# Patient Record
Sex: Female | Born: 1995 | Race: White | Hispanic: No | Marital: Single | Smoking: Former smoker
Health system: Southern US, Community
[De-identification: ages and names within clinical notes are randomized; demographics above are authoritative.]

## PROBLEM LIST (undated history)

## (undated) ENCOUNTER — Inpatient Hospital Stay: Payer: Self-pay

## (undated) DIAGNOSIS — Q51818 Other congenital malformations of uterus: Secondary | ICD-10-CM

## (undated) DIAGNOSIS — O99213 Obesity complicating pregnancy, third trimester: Secondary | ICD-10-CM

## (undated) DIAGNOSIS — A64 Unspecified sexually transmitted disease: Secondary | ICD-10-CM

## (undated) DIAGNOSIS — Z91410 Personal history of adult physical and sexual abuse: Secondary | ICD-10-CM

## (undated) DIAGNOSIS — F329 Major depressive disorder, single episode, unspecified: Secondary | ICD-10-CM

## (undated) DIAGNOSIS — F32A Depression, unspecified: Secondary | ICD-10-CM

## (undated) DIAGNOSIS — F4310 Post-traumatic stress disorder, unspecified: Secondary | ICD-10-CM

## (undated) DIAGNOSIS — IMO0002 Reserved for concepts with insufficient information to code with codable children: Secondary | ICD-10-CM

## (undated) DIAGNOSIS — Z6841 Body Mass Index (BMI) 40.0 and over, adult: Secondary | ICD-10-CM

## (undated) HISTORY — PX: NO PAST SURGERIES: SHX2092

---
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
## 2014-03-13 LAB — OB RESULTS CONSOLE HEPATITIS B SURFACE ANTIGEN: Hepatitis B Surface Ag: NEGATIVE

## 2014-03-13 LAB — OB RESULTS CONSOLE RUBELLA ANTIBODY, IGM: RUBELLA: IMMUNE

## 2014-03-13 LAB — OB RESULTS CONSOLE GBS: STREP GROUP B AG: POSITIVE

## 2014-03-13 LAB — OB RESULTS CONSOLE GC/CHLAMYDIA
Chlamydia: NEGATIVE
GC PROBE AMP, GENITAL: NEGATIVE

## 2014-03-13 LAB — OB RESULTS CONSOLE VARICELLA ZOSTER ANTIBODY, IGG: VARICELLA IGG: IMMUNE

## 2014-03-13 LAB — OB RESULTS CONSOLE HIV ANTIBODY (ROUTINE TESTING): HIV: NONREACTIVE

## 2014-04-24 NOTE — L&D Delivery Note (Addendum)
Delivery Summary for Iona BeardMegan D Gillham  Labor Events:   Preterm labor:   Rupture date:   Rupture time:   Rupture type: Spontaneous  Fluid Color: Particulate Meconium  Induction:   Augmentation:   Complications:   Cervical ripening:          Delivery:   Episiotomy:   Lacerations:   Repair suture:   Repair # of packets:   Blood loss (ml): 600   Information for the patient's newborn:  Verdene LennertJudy, Boy Laparis [[id number]][[id number]]    Delivery 11/05/2014 1:13 AM by  Vaginal, Vacuum (Extractor) Sex:  female Gestational Age: 2552w2d Delivery Clinician:  Hildred LaserAnika Tristine Langi Living?: Yes        APGARS  One minute Five minutes Ten minutes  Skin color: 0   1      Heart rate: 2   2      Grimace: 2   2      Muscle tone: 0   1      Breathing: 2   2      Totals: 6  8      Presentation/position: Vertex   Occiput Anterior Resuscitation: See NICU Consult (infant's chart) Yes = See NRP documentation in Infant Chart  Cord information: 3 vessels   Disposition of cord blood:     Blood gases sent?  Complications:   Placenta: Delivered: 11/05/2014 1:26 AM  Spontaneous  Intact appearance Newborn Measurements: Weight: 8 lb 3.9 oz (3739 g)  Height: 22.05"  Head circumference:    Chest circumference:    Other providers: Delivery Nurse Vennie HomansMichele A Taylor  Additional  information: Forceps:   Vacuum: Low  Breech:   Observed anomalies       See vacuum delivery procedure note for details of delivery (including vacuum assistance and shoulder dystocia).   Hildred LaserAnika Pheng Prokop, MD [facility] 11/05/2014 1:48 AM

## 2014-10-07 ENCOUNTER — Emergency Department
Admission: EM | Admit: 2014-10-07 | Discharge: 2014-10-07 | Disposition: A | Discharge status: Home / Self Care | Payer: Medicaid Other | Attending: Emergency Medicine | Admitting: Emergency Medicine

## 2014-10-07 ENCOUNTER — Encounter: Payer: Self-pay | Admitting: General Practice

## 2014-10-07 DIAGNOSIS — Z87891 Personal history of nicotine dependence: Secondary | ICD-10-CM | POA: Insufficient documentation

## 2014-10-07 DIAGNOSIS — S80811A Abrasion, right lower leg, initial encounter: Secondary | ICD-10-CM | POA: Insufficient documentation

## 2014-10-07 DIAGNOSIS — Z3A36 36 weeks gestation of pregnancy: Secondary | ICD-10-CM | POA: Diagnosis not present

## 2014-10-07 DIAGNOSIS — W231XXA Caught, crushed, jammed, or pinched between stationary objects, initial encounter: Secondary | ICD-10-CM | POA: Insufficient documentation

## 2014-10-07 DIAGNOSIS — Y998 Other external cause status: Secondary | ICD-10-CM | POA: Insufficient documentation

## 2014-10-07 DIAGNOSIS — Y9289 Other specified places as the place of occurrence of the external cause: Secondary | ICD-10-CM | POA: Insufficient documentation

## 2014-10-07 DIAGNOSIS — Y9389 Activity, other specified: Secondary | ICD-10-CM | POA: Diagnosis not present

## 2014-10-07 DIAGNOSIS — O9A213 Injury, poisoning and certain other consequences of external causes complicating pregnancy, third trimester: Secondary | ICD-10-CM | POA: Insufficient documentation

## 2014-10-07 MED ORDER — ACETAMINOPHEN 325 MG PO TABS
650.0000 mg | ORAL_TABLET | Freq: Once | ORAL | Status: AC
  Administered 2014-10-07: 650 mg via ORAL

## 2014-10-07 MED ORDER — ACETAMINOPHEN 325 MG PO TABS
ORAL_TABLET | ORAL | Status: AC
  Administered 2014-10-07: 650 mg via ORAL
  Filled 2014-10-07: qty 2

## 2014-10-07 MED ORDER — BACITRACIN ZINC 500 UNIT/GM EX OINT
TOPICAL_OINTMENT | CUTANEOUS | Status: AC
  Filled 2014-10-07: qty 1.8

## 2014-10-07 MED ORDER — BACITRACIN ZINC 500 UNIT/GM EX OINT: TOPICAL_OINTMENT | Freq: Two times a day (BID) | CUTANEOUS | Status: DC

## 2014-10-07 NOTE — Discharge Instructions (Signed)
Exam is reassuring and your be treated for abrasions. Clean with soap and water which is mild, and place and antibiotic ointment or Vaseline over the abrasions and cover with gauze until healed. Return to the emergency department for any new or worsening calf pain, calf swelling, or any signs of infection including wound drainage redness or pain.   Abrasion An abrasion is a cut or scrape of the skin. Abrasions do not extend through all layers of the skin and most heal within 10 days. It is important to care for your abrasion properly to prevent infection. CAUSES  Most abrasions are caused by falling on, or gliding across, the ground or other surface. When your skin rubs on something, the outer and inner layer of skin rubs off, causing an abrasion. DIAGNOSIS  Your caregiver will be able to diagnose an abrasion during a physical exam.  TREATMENT  Your treatment depends on how large and deep the abrasion is. Generally, your abrasion will be cleaned with water and a mild soap to remove any dirt or debris. An antibiotic ointment may be put over the abrasion to prevent an infection. A bandage (dressing) may be wrapped around the abrasion to keep it from getting dirty.  You may need a tetanus shot if:  You cannot remember when you had your last tetanus shot.  You have never had a tetanus shot.  The injury broke your skin. If you get a tetanus shot, your arm may swell, get red, and feel warm to the touch. This is common and not a problem. If you need a tetanus shot and you choose not to have one, there is a rare chance of getting tetanus. Sickness from tetanus can be serious.  HOME CARE INSTRUCTIONS   If a dressing was applied, change it at least once a day or as directed by your caregiver. If the bandage sticks, soak it off with warm water.   Wash the area with water and a mild soap to remove all the ointment 2 times a day. Rinse off the soap and pat the area dry with a clean towel.   Reapply any  ointment as directed by your caregiver. This will help prevent infection and keep the bandage from sticking. Use gauze over the wound and under the dressing to help keep the bandage from sticking.   Change your dressing right away if it becomes wet or dirty.   Only take over-the-counter or prescription medicines for pain, discomfort, or fever as directed by your caregiver.   Follow up with your caregiver within 24-48 hours for a wound check, or as directed. If you were not given a wound-check appointment, look closely at your abrasion for redness, swelling, or pus. These are signs of infection. SEEK IMMEDIATE MEDICAL CARE IF:   You have increasing pain in the wound.   You have redness, swelling, or tenderness around the wound.   You have pus coming from the wound.   You have a fever or persistent symptoms for more than 2-3 days.  You have a fever and your symptoms suddenly get worse.  You have a bad smell coming from the wound or dressing.  MAKE SURE YOU:   Understand these instructions.  Will watch your condition.  Will get help right away if you are not doing well or get worse. Document Released: 01/18/2005 Document Revised: 03/27/2012 Document Reviewed: 03/14/2011 [facility] Patient Information 2015 [place], [place]. This information is not intended to replace advice given to you by your health care  provider. Make sure you discuss any questions you have with your health care provider. ° °
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
## 2014-10-07 NOTE — ED Notes (Signed)
Pt discharged home after verbalizing understanding of discharge instructions; nad noted. 

## 2014-10-07 NOTE — ED Notes (Signed)
Pt barcode not scanned because she doesn't have a bracelet. Pt access notified and they will get her a bracelet.

## 2014-10-08 ENCOUNTER — Observation Stay
Admission: EM | Admit: 2014-10-08 | Discharge: 2014-10-08 | Disposition: A | Discharge status: Home / Self Care | Payer: Medicaid Other | Attending: Obstetrics and Gynecology | Admitting: Obstetrics and Gynecology

## 2014-10-08 DIAGNOSIS — O26899 Other specified pregnancy related conditions, unspecified trimester: Principal | ICD-10-CM | POA: Insufficient documentation

## 2014-10-08 DIAGNOSIS — M549 Dorsalgia, unspecified: Secondary | ICD-10-CM | POA: Diagnosis present

## 2014-10-08 HISTORY — DX: Depression, unspecified: F32.A

## 2014-10-08 HISTORY — DX: Major depressive disorder, single episode, unspecified: F32.9

## 2014-10-08 LAB — URINE DRUG SCREEN, QUALITATIVE (ARMC ONLY)
AMPHETAMINES, UR SCREEN: NOT DETECTED
BARBITURATES, UR SCREEN: NOT DETECTED
Benzodiazepine, Ur Scrn: NOT DETECTED
CANNABINOID 50 NG, UR ~~LOC~~: NOT DETECTED
Cocaine Metabolite,Ur ~~LOC~~: NOT DETECTED
MDMA (ECSTASY) UR SCREEN: NOT DETECTED
Methadone Scn, Ur: NOT DETECTED
OPIATE, UR SCREEN: NOT DETECTED
PHENCYCLIDINE (PCP) UR S: NOT DETECTED
Tricyclic, Ur Screen: NOT DETECTED

## 2014-10-08 LAB — PROTEIN / CREATININE RATIO, URINE
Creatinine, Urine: 116 mg/dL
PROTEIN CREATININE RATIO: 1.12 mg/mg{creat} — AB (ref 0.00–0.15)
Total Protein, Urine: 130 mg/dL

## 2014-10-08 LAB — URINALYSIS COMPLETE WITH MICROSCOPIC (ARMC ONLY)
BACTERIA UA: NONE SEEN
BILIRUBIN URINE: NEGATIVE
GLUCOSE, UA: NEGATIVE mg/dL
Hgb urine dipstick: NEGATIVE
Ketones, ur: NEGATIVE mg/dL
Leukocytes, UA: NEGATIVE
Nitrite: NEGATIVE
Protein, ur: 100 mg/dL — AB
SPECIFIC GRAVITY, URINE: 1.012 (ref 1.005–1.030)
pH: 6 (ref 5.0–8.0)

## 2014-10-08 LAB — COMPREHENSIVE METABOLIC PANEL
ALT: 20 U/L (ref 14–54)
AST: 24 U/L (ref 15–41)
Albumin: 2.8 g/dL — ABNORMAL LOW (ref 3.5–5.0)
Alkaline Phosphatase: 159 U/L — ABNORMAL HIGH (ref 38–126)
Anion gap: 10 (ref 5–15)
BUN: 5 mg/dL — ABNORMAL LOW (ref 6–20)
CALCIUM: 8.6 mg/dL — AB (ref 8.9–10.3)
CO2: 20 mmol/L — AB (ref 22–32)
CREATININE: 0.57 mg/dL (ref 0.44–1.00)
Chloride: 107 mmol/L (ref 101–111)
GFR calc non Af Amer: >60 mL/min (ref >60)
Glucose, Bld: 89 mg/dL (ref 65–99)
Potassium: 3.5 mmol/L (ref 3.5–5.1)
Sodium: 137 mmol/L (ref 135–145)
TOTAL PROTEIN: 6.7 g/dL (ref 6.5–8.1)
Total Bilirubin: 0.3 mg/dL (ref 0.3–1.2)

## 2014-10-08 LAB — CBC
HCT: 36.8 % (ref 35.0–47.0)
HEMOGLOBIN: 12.8 g/dL (ref 12.0–16.0)
MCH: 30.4 pg (ref 26.0–34.0)
MCHC: 34.7 g/dL (ref 32.0–36.0)
MCV: 87.8 fL (ref 80.0–100.0)
Platelets: 273 10*3/uL (ref 150–440)
RBC: 4.2 MIL/uL (ref 3.80–5.20)
RDW: 13.9 % (ref 11.5–14.5)
WBC: 12 10*3/uL — AB (ref 3.6–11.0)

## 2014-10-08 MED ORDER — HYDRALAZINE HCL 20 MG/ML IJ SOLN: 10.0000 mg | Freq: Once | INTRAMUSCULAR | Status: DC | PRN

## 2014-10-08 MED ORDER — LABETALOL HCL 5 MG/ML IV SOLN
20.0000 mg | INTRAVENOUS | Status: DC | PRN
Start: 2014-10-08 — End: 2014-10-09

## 2014-10-08 MED ORDER — ACETAMINOPHEN 325 MG PO TABS
ORAL_TABLET | ORAL | Status: AC
  Administered 2014-10-08: 650 mg via ORAL
  Filled 2014-10-08: qty 2

## 2014-10-08 MED ORDER — ACETAMINOPHEN 325 MG PO TABS
650.0000 mg | ORAL_TABLET | Freq: Four times a day (QID) | ORAL | Status: DC | PRN
  Administered 2014-10-08: 650 mg via ORAL

## 2014-10-08 NOTE — OB Triage Note (Addendum)
Call provider or return to birthplace with: ° °1. Regular contractions °2. Leaking of fluid from your vaginal °3. Vaginal bleeding: Bright red or heavy like a period °4. Decreased Fetal movement ° °

## 2014-10-08 NOTE — OB Triage Note (Signed)
Preterm d/c instructions reviewed. AVS given and discussed. Pt d/c home.  
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
## 2014-10-09 LAB — CHLAMYDIA/NGC RT PCR (ARMC ONLY)
Chlamydia Tr: NOT DETECTED
N gonorrhoeae: NOT DETECTED

## 2014-11-02 ENCOUNTER — Observation Stay
Admission: EM | Admit: 2014-11-02 | Discharge: 2014-11-02 | Disposition: A | Discharge status: Home / Self Care | Payer: Medicaid Other | Attending: Obstetrics and Gynecology | Admitting: Obstetrics and Gynecology

## 2014-11-02 DIAGNOSIS — O479 False labor, unspecified: Secondary | ICD-10-CM | POA: Diagnosis present

## 2014-11-02 LAB — PROTEIN / CREATININE RATIO, URINE
CREATININE, URINE: 159 mg/dL
Protein Creatinine Ratio: 0.36 mg/mg{Cre} — ABNORMAL HIGH (ref 0.00–0.15)
TOTAL PROTEIN, URINE: 58 mg/dL

## 2014-11-02 LAB — COMPREHENSIVE METABOLIC PANEL
ALBUMIN: 2.8 g/dL — AB (ref 3.5–5.0)
ALT: 10 U/L — AB (ref 14–54)
AST: 18 U/L (ref 15–41)
Alkaline Phosphatase: 171 U/L — ABNORMAL HIGH (ref 38–126)
Anion gap: 9 (ref 5–15)
BILIRUBIN TOTAL: 0.5 mg/dL (ref 0.3–1.2)
BUN: 7 mg/dL (ref 6–20)
CO2: 22 mmol/L (ref 22–32)
CREATININE: 0.53 mg/dL (ref 0.44–1.00)
Calcium: 8.7 mg/dL — ABNORMAL LOW (ref 8.9–10.3)
Chloride: 105 mmol/L (ref 101–111)
GFR calc Af Amer: >60 mL/min (ref >60)
GFR calc non Af Amer: >60 mL/min (ref >60)
Glucose, Bld: 80 mg/dL (ref 65–99)
Potassium: 4 mmol/L (ref 3.5–5.1)
Sodium: 136 mmol/L (ref 135–145)
Total Protein: 6.5 g/dL (ref 6.5–8.1)

## 2014-11-02 LAB — CBC
HCT: 35.8 % (ref 35.0–47.0)
HEMOGLOBIN: 12.1 g/dL (ref 12.0–16.0)
MCH: 29.6 pg (ref 26.0–34.0)
MCHC: 34 g/dL (ref 32.0–36.0)
MCV: 87.2 fL (ref 80.0–100.0)
Platelets: 275 10*3/uL (ref 150–440)
RBC: 4.1 MIL/uL (ref 3.80–5.20)
RDW: 14.1 % (ref 11.5–14.5)
WBC: 14.4 10*3/uL — AB (ref 3.6–11.0)

## 2014-11-02 LAB — URIC ACID: Uric Acid, Serum: 4.9 mg/dL (ref 2.3–6.6)

## 2014-11-02 NOTE — OB Triage Note (Signed)
Dr Valentino Saxonherry called and notified of pts arrival, pt is G1 at 39.6 weeks with c/o ctxs, [facility]. Notified of pts SVE, ctx pattern, FHR reactive with intermittant vbl decels, elevated BP's, pt missed her last appt the end of June so no recent SVE has been done. 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 Orders received to draw PIH labs and recheck cervix x 1.5 hours.

## 2014-11-02 NOTE — OB Triage Note (Signed)
Received pt to LDR 3 from home with c/o ctxs since 0300 this AM. Pt states good FM, denies any vag bleeding or SROM but c/o increase in vag discharge. Urine specimen obtained and EFM applied.

## 2014-11-04 ENCOUNTER — Encounter: Payer: Self-pay | Admitting: Obstetrics and Gynecology

## 2014-11-04 ENCOUNTER — Inpatient Hospital Stay: Payer: Medicaid Other | Admitting: Anesthesiology

## 2014-11-04 ENCOUNTER — Inpatient Hospital Stay
Admission: EM | Admit: 2014-11-04 | Discharge: 2014-11-07 | DRG: 774 | Disposition: A | Discharge status: Home / Self Care | Payer: Medicaid Other | Attending: Obstetrics and Gynecology | Admitting: Obstetrics and Gynecology

## 2014-11-04 DIAGNOSIS — F4310 Post-traumatic stress disorder, unspecified: Secondary | ICD-10-CM | POA: Diagnosis present

## 2014-11-04 DIAGNOSIS — O411030 Infection of amniotic sac and membranes, unspecified, third trimester, not applicable or unspecified: Secondary | ICD-10-CM | POA: Diagnosis present

## 2014-11-04 DIAGNOSIS — Z3A40 40 weeks gestation of pregnancy: Secondary | ICD-10-CM | POA: Diagnosis present

## 2014-11-04 DIAGNOSIS — O9081 Anemia of the puerperium: Secondary | ICD-10-CM | POA: Diagnosis not present

## 2014-11-04 DIAGNOSIS — Z6841 Body Mass Index (BMI) 40.0 and over, adult: Secondary | ICD-10-CM

## 2014-11-04 DIAGNOSIS — R000 Tachycardia, unspecified: Secondary | ICD-10-CM | POA: Diagnosis not present

## 2014-11-04 DIAGNOSIS — Z87891 Personal history of nicotine dependence: Secondary | ICD-10-CM | POA: Diagnosis not present

## 2014-11-04 DIAGNOSIS — Q513 Bicornate uterus: Secondary | ICD-10-CM

## 2014-11-04 DIAGNOSIS — Z91410 Personal history of adult physical and sexual abuse: Secondary | ICD-10-CM | POA: Diagnosis not present

## 2014-11-04 DIAGNOSIS — O133 Gestational [pregnancy-induced] hypertension without significant proteinuria, third trimester: Secondary | ICD-10-CM | POA: Diagnosis present

## 2014-11-04 DIAGNOSIS — O99324 Drug use complicating childbirth: Secondary | ICD-10-CM | POA: Diagnosis present

## 2014-11-04 DIAGNOSIS — O99214 Obesity complicating childbirth: Secondary | ICD-10-CM | POA: Diagnosis present

## 2014-11-04 DIAGNOSIS — O480 Post-term pregnancy: Secondary | ICD-10-CM | POA: Diagnosis present

## 2014-11-04 DIAGNOSIS — Z862 Personal history of diseases of the blood and blood-forming organs and certain disorders involving the immune mechanism: Secondary | ICD-10-CM

## 2014-11-04 DIAGNOSIS — F329 Major depressive disorder, single episode, unspecified: Secondary | ICD-10-CM | POA: Diagnosis present

## 2014-11-04 DIAGNOSIS — O99824 Streptococcus B carrier state complicating childbirth: Secondary | ICD-10-CM | POA: Diagnosis present

## 2014-11-04 DIAGNOSIS — F1210 Cannabis abuse, uncomplicated: Secondary | ICD-10-CM | POA: Diagnosis present

## 2014-11-04 DIAGNOSIS — R509 Fever, unspecified: Secondary | ICD-10-CM | POA: Diagnosis not present

## 2014-11-04 DIAGNOSIS — O411230 Chorioamnionitis, third trimester, not applicable or unspecified: Secondary | ICD-10-CM | POA: Diagnosis present

## 2014-11-04 DIAGNOSIS — Z8759 Personal history of other complications of pregnancy, childbirth and the puerperium: Secondary | ICD-10-CM

## 2014-11-04 HISTORY — DX: Unspecified sexually transmitted disease: A64

## 2014-11-04 HISTORY — DX: Post-traumatic stress disorder, unspecified: F43.10

## 2014-11-04 HISTORY — DX: Reserved for concepts with insufficient information to code with codable children: IMO0002

## 2014-11-04 HISTORY — DX: Body Mass Index (BMI) 40.0 and over, adult: Z684

## 2014-11-04 HISTORY — DX: Personal history of adult physical and sexual abuse: Z91.410

## 2014-11-04 HISTORY — DX: Other congenital malformations of uterus: Q51.818

## 2014-11-04 HISTORY — DX: Obesity complicating pregnancy, third trimester: O99.213

## 2014-11-04 LAB — COMPREHENSIVE METABOLIC PANEL
ALK PHOS: 170 U/L — AB (ref 38–126)
ALT: 15 U/L (ref 14–54)
AST: 29 U/L (ref 15–41)
Albumin: 2.8 g/dL — ABNORMAL LOW (ref 3.5–5.0)
Anion gap: 14 (ref 5–15)
BUN: 7 mg/dL (ref 6–20)
CALCIUM: 8.8 mg/dL — AB (ref 8.9–10.3)
CO2: 18 mmol/L — AB (ref 22–32)
CREATININE: 0.51 mg/dL (ref 0.44–1.00)
Chloride: 102 mmol/L (ref 101–111)
Glucose, Bld: 124 mg/dL — ABNORMAL HIGH (ref 65–99)
POTASSIUM: 3.5 mmol/L (ref 3.5–5.1)
SODIUM: 134 mmol/L — AB (ref 135–145)
Total Bilirubin: 0.5 mg/dL (ref 0.3–1.2)
Total Protein: 7 g/dL (ref 6.5–8.1)

## 2014-11-04 LAB — URINE DRUG SCREEN, QUALITATIVE (ARMC ONLY)
Amphetamines, Ur Screen: NOT DETECTED
BARBITURATES, UR SCREEN: NOT DETECTED
BENZODIAZEPINE, UR SCRN: NOT DETECTED
COCAINE METABOLITE, UR ~~LOC~~: NOT DETECTED
Cannabinoid 50 Ng, Ur ~~LOC~~: NOT DETECTED
MDMA (Ecstasy)Ur Screen: NOT DETECTED
METHADONE SCREEN, URINE: NOT DETECTED
Opiate, Ur Screen: POSITIVE — AB
PHENCYCLIDINE (PCP) UR S: NOT DETECTED
TRICYCLIC, UR SCREEN: NOT DETECTED

## 2014-11-04 LAB — CBC
HCT: 36.2 % (ref 35.0–47.0)
Hemoglobin: 12.2 g/dL (ref 12.0–16.0)
MCH: 29.2 pg (ref 26.0–34.0)
MCHC: 33.6 g/dL (ref 32.0–36.0)
MCV: 87 fL (ref 80.0–100.0)
PLATELETS: 266 10*3/uL (ref 150–440)
RBC: 4.16 MIL/uL (ref 3.80–5.20)
RDW: 14 % (ref 11.5–14.5)
WBC: 21 10*3/uL — AB (ref 3.6–11.0)

## 2014-11-04 LAB — TYPE AND SCREEN
ABO/RH(D): B POS
Antibody Screen: NEGATIVE

## 2014-11-04 LAB — PROTEIN / CREATININE RATIO, URINE
Creatinine, Urine: 132 mg/dL
PROTEIN CREATININE RATIO: 1.2 mg/mg{creat} — AB (ref 0.00–0.15)
Total Protein, Urine: 158 mg/dL

## 2014-11-04 LAB — ABO/RH: ABO/RH(D): B POS

## 2014-11-04 LAB — URIC ACID: Uric Acid, Serum: 5.7 mg/dL (ref 2.3–6.6)

## 2014-11-04 MED ORDER — PROMETHAZINE HCL 25 MG/ML IJ SOLN
INTRAMUSCULAR | Status: AC
  Administered 2014-11-04: 13:00:00 via INTRAMUSCULAR
  Filled 2014-11-04: qty 1

## 2014-11-04 MED ORDER — PHENYLEPHRINE 40 MCG/ML (10ML) SYRINGE FOR IV PUSH (FOR BLOOD PRESSURE SUPPORT): 80.0000 ug | PREFILLED_SYRINGE | INTRAVENOUS | Status: DC | PRN

## 2014-11-04 MED ORDER — LIDOCAINE HCL (PF) 1 % IJ SOLN
INTRAMUSCULAR | Status: AC
  Filled 2014-11-04: qty 30

## 2014-11-04 MED ORDER — AMMONIA AROMATIC IN INHA
RESPIRATORY_TRACT | Status: AC
  Filled 2014-11-04: qty 10

## 2014-11-04 MED ORDER — LACTATED RINGERS IV SOLN
INTRAVENOUS | Status: DC
  Administered 2014-11-04 (×3): via INTRAVENOUS

## 2014-11-04 MED ORDER — LACTATED RINGERS IV SOLN
500.0000 mL | INTRAVENOUS | Status: DC | PRN
  Administered 2014-11-04: 500 mL via INTRAVENOUS

## 2014-11-04 MED ORDER — FENTANYL 2.5 MCG/ML W/ROPIVACAINE 0.2% IN NS 100 ML EPIDURAL INFUSION (ARMC-ANES)
10.0000 mL/h | EPIDURAL | Status: DC
  Administered 2014-11-04 (×3): 10 mL/h via EPIDURAL
  Filled 2014-11-04 (×2): qty 100

## 2014-11-04 MED ORDER — SODIUM CHLORIDE 0.9 % IV SOLN
2.0000 g | Freq: Once | INTRAVENOUS | Status: AC
  Administered 2014-11-04: 2 g via INTRAVENOUS
  Filled 2014-11-04: qty 2000

## 2014-11-04 MED ORDER — MISOPROSTOL 200 MCG PO TABS
ORAL_TABLET | ORAL | Status: AC
  Administered 2014-11-05: 800 ug via RECTAL
  Filled 2014-11-04: qty 4

## 2014-11-04 MED ORDER — LIDOCAINE HCL (PF) 1 % IJ SOLN
INTRAMUSCULAR | Status: AC | PRN
  Administered 2014-11-04: 3 mL

## 2014-11-04 MED ORDER — LIDOCAINE-EPINEPHRINE (PF) 1.5 %-1:200000 IJ SOLN
INTRAMUSCULAR | Status: AC | PRN
  Administered 2014-11-04: 3 mL via EPIDURAL

## 2014-11-04 MED ORDER — OXYTOCIN 10 UNIT/ML IJ SOLN
INTRAMUSCULAR | Status: AC
  Filled 2014-11-04: qty 2

## 2014-11-04 MED ORDER — LABETALOL HCL 100 MG PO TABS
100.0000 mg | ORAL_TABLET | Freq: Once | ORAL | Status: AC
  Administered 2014-11-04: 100 mg via ORAL
  Filled 2014-11-04: qty 1

## 2014-11-04 MED ORDER — BUPIVACAINE HCL (PF) 0.25 % IJ SOLN
INTRAMUSCULAR | Status: AC | PRN
  Administered 2014-11-04: 5 mL
  Administered 2014-11-04 (×2): 4 mL
  Administered 2014-11-04: 5 mL

## 2014-11-04 MED ORDER — DIPHENHYDRAMINE HCL 50 MG/ML IJ SOLN: 12.5000 mg | INTRAMUSCULAR | Status: DC | PRN

## 2014-11-04 MED ORDER — EPHEDRINE 5 MG/ML INJ: 10.0000 mg | INTRAVENOUS | Status: DC | PRN

## 2014-11-04 MED ORDER — SODIUM CHLORIDE 0.9 % IV SOLN
1.0000 g | INTRAVENOUS | Status: DC
  Administered 2014-11-04 – 2014-11-05 (×4): 1 g via INTRAVENOUS
  Filled 2014-11-04 (×4): qty 1000

## 2014-11-04 MED ORDER — BUTORPHANOL TARTRATE 1 MG/ML IJ SOLN
INTRAMUSCULAR | Status: AC
  Administered 2014-11-04: 2 mg via INTRAVENOUS
  Filled 2014-11-04: qty 2

## 2014-11-04 MED ORDER — OXYTOCIN 40 UNITS IN LACTATED RINGERS INFUSION - SIMPLE MED
1.0000 m[IU]/min | INTRAVENOUS | Status: DC
  Administered 2014-11-04: 1 m[IU]/min via INTRAVENOUS
  Filled 2014-11-04: qty 1000

## 2014-11-04 MED ORDER — TERBUTALINE SULFATE 1 MG/ML IJ SOLN: 0.2500 mg | Freq: Once | INTRAMUSCULAR | Status: AC | PRN

## 2014-11-04 MED ORDER — BUTORPHANOL TARTRATE 1 MG/ML IJ SOLN
2.0000 mg | Freq: Once | INTRAMUSCULAR | Status: AC
  Administered 2014-11-04: 2 mg via INTRAVENOUS

## 2014-11-04 MED ORDER — DIPHENHYDRAMINE HCL 50 MG/ML IJ SOLN: 25.0000 mg | Freq: Four times a day (QID) | INTRAMUSCULAR | Status: DC | PRN

## 2014-11-04 MED ORDER — PROMETHAZINE HCL 25 MG/ML IJ SOLN: 25.0000 mg | INTRAMUSCULAR | Status: DC | PRN

## 2014-11-04 NOTE — Progress Notes (Signed)
Intrapartum Progress Note  S: Patient more comfortable.  Has had epidural rebolused with minimal relief.  Given dose of Stadol with marked relief in pain.    O: Blood pressure 133/72, pulse 135, temperature 98.3 F (36.8 C), temperature source Oral, resp. rate 24, height 5\' 1"  (1.549 m), weight 248 lb (112.492 kg), last menstrual period 01/27/2014. Gen App: NAD, comfortable Abdomen: soft, gravid FHT: 140 bpm.  Accels present, occasional shallow variable decels. Moderate variability.   Tocometer:  Contractions q 3-6 min Cervix: 9/90/0/c.  IUPC in place. Extremities: Nontender, no edema.   Labs:  Lab Results  Component Value Date   WBC 21.0* 11/04/2014   HGB 12.2 11/04/2014   HCT 36.2 11/04/2014   MCV 87.0 11/04/2014   PLT 266 11/04/2014     Assessment:  1: SIUP at 6046w1d 2. Gestational HTN 3. SROM 4. GBS+ 5. Latent labor 6. Tachycardia (persistent)  Plan:  1. PIH labs neg.  Continue to monitor BPs, Labetalol IV for BPs > 160/110 2. Ampicillin for GBS 3. IUPC in place 4. Pitocin for augmentation per protocol as needed.  5. Patient still with sinus tachycardia.  Remains asymptomatic. If still present postpartum, will likely get cardiac consult.  6.  Expectant management, anticipate vaginal delivery.    Hildred LaserAnika Rukaya Kleinschmidt, MD 11/04/2014 5:12 PM

## 2014-11-04 NOTE — Progress Notes (Signed)
Intrapartum Progress Note  S: Patient c/o pain on left side near hip with contractions.    O: Blood pressure 132/75, pulse 112, temperature 98.3 F (36.8 C), temperature source Oral, resp. rate 24, height 5\' 1"  (1.549 m), weight 248 lb (112.492 kg), last menstrual period 01/27/2014. Gen App: NAD, comfortable Abdomen: soft, gravid FHT: 138 bpm.  Accels present, no decels. Moderate variability.   Tocometer:  Irregular contractions Cervix: 5/75/-2/c/intact.  Patient had SROM ~ 2 min after cervical check, bloody fluid.  Extremities: Nontender, no edema.   Labs:  Lab Results  Component Value Date   WBC 21.0* 11/04/2014   HGB 12.2 11/04/2014   HCT 36.2 11/04/2014   MCV 87.0 11/04/2014   PLT 266 11/04/2014     Assessment:  1: SIUP at 5976w1d 2. Gestational HTN 3. SROM 4. GBS+ 5. Latent labor 6. Tachycardia (persistent)  Plan:  1. PIH labs neg.  Continue to monitor BPs, Labetalol for BPs > 160/110 2. Ampicillin for GBS 3. IUPC placed to better monitor contractions 4. Pitocin for augmentation per protocol as needed.  5. EKG performed for persistent tachycardia > 130s.  EKG negative except sinus tachycardia.  Dose of Labetalol PO to be given. 6.  Expectant management, anticipate vaginal delivery.    Hildred LaserAnika Ruthmary Occhipinti, MD 11/04/2014 12:49 PM

## 2014-11-04 NOTE — Progress Notes (Signed)
Transition of care note:   Notified by nurse that patient had been admitted under different provider as she was previously thought to be an unassigned patient, however has been seen several times in L&D triage by [facility] providers.  [facility]  [facility]   H&P reviewed, performed by Dr. Thomasene MohairStephen Jackson, agree with current plan of care. I will continue current care and  manage from this point forward.    Renee LaserAnika Kien Mirsky, MD [facility]

## 2014-11-04 NOTE — Anesthesia Procedure Notes (Addendum)
Epidural Patient location during procedure: OB Start time: 11/04/2014 7:45 AM End time: 11/04/2014 8:19 AM  Staffing Anesthesiologist: Forestine ChutePOLIN, CARRIE Resident/CRNA: Junious SilkNOLES, Jep Dyas Performed by: anesthesiologist and resident/CRNA   Preanesthetic Checklist Completed: patient identified, site marked, surgical consent, pre-op evaluation, timeout performed, IV checked, risks and benefits discussed and monitors and equipment checked  Epidural Patient position: sitting Prep: Betadine Patient monitoring: heart rate, continuous pulse ox and blood pressure Approach: midline Location: L4-L5 Injection technique: LOR saline  Needle:  Needle type: Tuohy  Needle gauge: 18 G Needle length: 9 cm and 9 Needle insertion depth: 7 cm Catheter type: closed end flexible Catheter size: 20 Guage Catheter at skin depth: 12 cm Test dose: negative and 1.5% lidocaine with Epi 1:200 K  Assessment Sensory level: T10 Events: blood not aspirated, injection not painful, no injection resistance, negative IV test and no paresthesia  Additional Notes   Patient tolerated the insertion well without complications.  Harrel LemonM. Herald Vallin CRNA attempted 3 times.  [facility] for attempt which was successful.  No complications and pt tolerated well.Reason for block:procedure for pain

## 2014-11-04 NOTE — Progress Notes (Signed)
Cardiopulmonary at bedside to perform EKG

## 2014-11-04 NOTE — Progress Notes (Signed)
Patient was seen by Dr. Valentino Saxonherry at previous visit. Dr. Jean RosenthalJackson made aware and transferred care to Dr. Valentino Saxonherry. Dr. Valentino Saxonherry notified that patient was in unit.
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
## 2014-11-04 NOTE — Progress Notes (Signed)
Catheter not tolerated by patient. Pt.  Wants it removed. Educated patient about the need for in and out cath. during labor with an epidural and associated risk for infection with procedure. Catheter removed per patient request.

## 2014-11-04 NOTE — Progress Notes (Signed)
Dr Valentino Saxonherry p[assing in/out catheter for approx 50  Cc urine- pt tolerated procedure well

## 2014-11-04 NOTE — Progress Notes (Signed)
In/out catheter passed per Dr Valentino Saxonherry fdor approx 100 cc urine- pt tolerated procedure well.

## 2014-11-04 NOTE — Anesthesia Preprocedure Evaluation (Addendum)
Anesthesia Evaluation  Patient identified by MRN, date of birth, ID band Patient awake    Reviewed: Allergy & Precautions, NPO status , Patient's Chart, lab work & pertinent test results  History of Anesthesia Complications Negative for: history of anesthetic complications  Airway Mallampati: III  TM Distance: >3 FB Neck ROM: Full    Dental no notable dental hx.    Pulmonary neg pulmonary ROS, former smoker,  breath sounds clear to auscultation  Pulmonary exam normal       Cardiovascular negative cardio ROS Normal cardiovascular examRhythm:Regular Rate:Normal     Neuro/Psych Depression negative neurological ROS     GI/Hepatic negative GI ROS, Neg liver ROS,   Endo/Other  Morbid obesity  Renal/GU negative Renal ROS  negative genitourinary   Musculoskeletal negative musculoskeletal ROS (+)   Abdominal   Peds negative pediatric ROS (+)  Hematology negative hematology ROS (+)   Anesthesia Other Findings   Reproductive/Obstetrics (+) Pregnancy                            Anesthesia Physical Anesthesia Plan  ASA: III  Anesthesia Plan: Epidural   Post-op Pain Management:    Induction:   Airway Management Planned:   Additional Equipment:   Intra-op Plan:   Post-operative Plan:   Informed Consent: I have reviewed the patients History and Physical, chart, labs and discussed the procedure including the risks, benefits and alternatives for the proposed anesthesia with the patient or authorized representative who has indicated his/her understanding and acceptance.     Plan Discussed with:   Anesthesia Plan Comments:         Anesthesia Quick Evaluation

## 2014-11-05 ENCOUNTER — Encounter
Admission: EM | Disposition: A | Discharge status: Home / Self Care | Payer: Self-pay | Source: Home / Self Care | Attending: Obstetrics and Gynecology

## 2014-11-05 LAB — RPR: RPR: NONREACTIVE

## 2014-11-05 LAB — CBC
HCT: 28.2 % — ABNORMAL LOW (ref 35.0–47.0)
Hemoglobin: 9.3 g/dL — ABNORMAL LOW (ref 12.0–16.0)
MCH: 29 pg (ref 26.0–34.0)
MCHC: 33.1 g/dL (ref 32.0–36.0)
MCV: 87.4 fL (ref 80.0–100.0)
PLATELETS: 282 10*3/uL (ref 150–440)
RBC: 3.23 MIL/uL — ABNORMAL LOW (ref 3.80–5.20)
RDW: 14.3 % (ref 11.5–14.5)
WBC: 27.6 10*3/uL — ABNORMAL HIGH (ref 3.6–11.0)

## 2014-11-05 SURGERY — Surgical Case
Anesthesia: Spinal

## 2014-11-05 MED ORDER — OXYCODONE-ACETAMINOPHEN 5-325 MG PO TABS
2.0000 | ORAL_TABLET | ORAL | Status: DC | PRN
  Administered 2014-11-06: 2 via ORAL
  Filled 2014-11-05: qty 2

## 2014-11-05 MED ORDER — CITRIC ACID-SODIUM CITRATE 334-500 MG/5ML PO SOLN
ORAL | Status: AC
  Filled 2014-11-05: qty 15

## 2014-11-05 MED ORDER — SENNOSIDES-DOCUSATE SODIUM 8.6-50 MG PO TABS
2.0000 | ORAL_TABLET | ORAL | Status: DC
  Filled 2014-11-05: qty 2

## 2014-11-05 MED ORDER — ZOLPIDEM TARTRATE 5 MG PO TABS: 5.0000 mg | ORAL_TABLET | Freq: Every evening | ORAL | Status: DC | PRN

## 2014-11-05 MED ORDER — CEFAZOLIN SODIUM-DEXTROSE 2-3 GM-% IV SOLR
INTRAVENOUS | Status: AC
  Filled 2014-11-05: qty 50

## 2014-11-05 MED ORDER — SIMETHICONE 80 MG PO CHEW
80.0000 mg | CHEWABLE_TABLET | ORAL | Status: DC | PRN
  Filled 2014-11-05: qty 1

## 2014-11-05 MED ORDER — ONDANSETRON HCL 4 MG/2ML IJ SOLN: 4.0000 mg | INTRAMUSCULAR | Status: DC | PRN

## 2014-11-05 MED ORDER — OXYCODONE-ACETAMINOPHEN 5-325 MG PO TABS: 1.0000 | ORAL_TABLET | ORAL | Status: DC | PRN

## 2014-11-05 MED ORDER — TETANUS-DIPHTH-ACELL PERTUSSIS 5-2.5-18.5 LF-MCG/0.5 IM SUSP: 0.5000 mL | Freq: Once | INTRAMUSCULAR | Status: DC

## 2014-11-05 MED ORDER — OXYTOCIN 10 UNIT/ML IJ SOLN
10.0000 [IU] | Freq: Once | INTRAMUSCULAR | Status: AC
  Administered 2014-11-05: 10 [IU] via INTRAMUSCULAR

## 2014-11-05 MED ORDER — GENTAMICIN SULFATE 40 MG/ML IJ SOLN
170.0000 mg | Freq: Three times a day (TID) | INTRAMUSCULAR | Status: AC
  Administered 2014-11-05 – 2014-11-06 (×3): 170 mg via INTRAVENOUS
  Filled 2014-11-05 (×3): qty 4.25

## 2014-11-05 MED ORDER — CARBOPROST TROMETHAMINE 250 MCG/ML IM SOLN
250.0000 ug | INTRAMUSCULAR | Status: DC | PRN
  Filled 2014-11-05: qty 1

## 2014-11-05 MED ORDER — CLINDAMYCIN PHOSPHATE 900 MG/50ML IV SOLN
900.0000 mg | Freq: Three times a day (TID) | INTRAVENOUS | Status: AC
  Administered 2014-11-05 – 2014-11-06 (×3): 900 mg via INTRAVENOUS
  Filled 2014-11-05 (×4): qty 50

## 2014-11-05 MED ORDER — IBUPROFEN 600 MG PO TABS
600.0000 mg | ORAL_TABLET | Freq: Four times a day (QID) | ORAL | Status: DC
  Administered 2014-11-05 – 2014-11-07 (×5): 600 mg via ORAL
  Filled 2014-11-05 (×6): qty 1

## 2014-11-05 MED ORDER — DIPHENHYDRAMINE HCL 25 MG PO CAPS: 25.0000 mg | ORAL_CAPSULE | Freq: Four times a day (QID) | ORAL | Status: DC | PRN

## 2014-11-05 MED ORDER — FERROUS SULFATE 325 (65 FE) MG PO TABS
325.0000 mg | ORAL_TABLET | Freq: Two times a day (BID) | ORAL | Status: DC
  Administered 2014-11-05 – 2014-11-07 (×5): 325 mg via ORAL
  Filled 2014-11-05 (×5): qty 1

## 2014-11-05 MED ORDER — OXYTOCIN 40 UNITS IN LACTATED RINGERS INFUSION - SIMPLE MED: 62.5000 mL/h | INTRAVENOUS | Status: DC | PRN

## 2014-11-05 MED ORDER — BENZOCAINE-MENTHOL 20-0.5 % EX AERO: 1.0000 "application " | INHALATION_SPRAY | CUTANEOUS | Status: DC | PRN

## 2014-11-05 MED ORDER — ONDANSETRON HCL 4 MG PO TABS
4.0000 mg | ORAL_TABLET | ORAL | Status: DC | PRN
  Filled 2014-11-05: qty 1

## 2014-11-05 MED ORDER — LACTATED RINGERS IV SOLN
INTRAVENOUS | Status: DC
  Administered 2014-11-05 – 2014-11-06 (×3): via INTRAVENOUS

## 2014-11-05 MED ORDER — DIBUCAINE 1 % RE OINT: 1.0000 "application " | TOPICAL_OINTMENT | RECTAL | Status: DC | PRN

## 2014-11-05 MED ORDER — LANOLIN HYDROUS EX OINT: TOPICAL_OINTMENT | CUTANEOUS | Status: DC | PRN

## 2014-11-05 MED ORDER — WITCH HAZEL-GLYCERIN EX PADS: 1.0000 "application " | MEDICATED_PAD | CUTANEOUS | Status: DC | PRN

## 2014-11-05 MED ORDER — MEASLES, MUMPS & RUBELLA VAC ~~LOC~~ INJ
0.5000 mL | INJECTION | Freq: Once | SUBCUTANEOUS | Status: DC
  Filled 2014-11-05: qty 0.5

## 2014-11-05 MED ORDER — IBUPROFEN 600 MG PO TABS
ORAL_TABLET | ORAL | Status: AC
  Administered 2014-11-05: 600 mg
  Filled 2014-11-05: qty 1

## 2014-11-05 MED ORDER — ACETAMINOPHEN 325 MG PO TABS: 650.0000 mg | ORAL_TABLET | ORAL | Status: DC | PRN

## 2014-11-05 MED ORDER — GENTAMICIN SULFATE 40 MG/ML IJ SOLN: Freq: Three times a day (TID) | INTRAVENOUS | Status: DC

## 2014-11-05 MED ORDER — PRENATAL MULTIVITAMIN CH
1.0000 | ORAL_TABLET | Freq: Every day | ORAL | Status: DC
  Administered 2014-11-05 – 2014-11-07 (×3): 1 via ORAL
  Filled 2014-11-05 (×4): qty 1

## 2014-11-05 SURGICAL SUPPLY — 25 items
BAG COUNTER SPONGE EZ (MISCELLANEOUS) IMPLANT
CANISTER SUCT 3000ML (MISCELLANEOUS) IMPLANT
CHLORAPREP W/TINT 26ML (MISCELLANEOUS) IMPLANT
COUNTER SPONGE BAG EZ (MISCELLANEOUS)
DRSG TELFA 3X8 NADH (GAUZE/BANDAGES/DRESSINGS) IMPLANT
GAUZE SPONGE 4X4 12PLY STRL (GAUZE/BANDAGES/DRESSINGS) IMPLANT
GLOVE BIO SURGEON STRL SZ 6 (GLOVE) IMPLANT
GLOVE BIOGEL PI IND STRL 6.5 (GLOVE) IMPLANT
GLOVE BIOGEL PI INDICATOR 6.5 (GLOVE)
GOWN STRL REUS W/ TWL LRG LVL3 (GOWN DISPOSABLE) IMPLANT
GOWN STRL REUS W/TWL LRG LVL3 (GOWN DISPOSABLE)
KIT RM TURNOVER STRD PROC AR (KITS) IMPLANT
MARKER SKIN W/RULER 31145785 (MISCELLANEOUS) IMPLANT
NS IRRIG 1000ML POUR BTL (IV SOLUTION) IMPLANT
PACK C SECTION AR (MISCELLANEOUS) IMPLANT
PAD GROUND ADULT SPLIT (MISCELLANEOUS) IMPLANT
PAD OB MATERNITY 4.3X12.25 (PERSONAL CARE ITEMS) IMPLANT
PAD PREP 24X41 OB/GYN DISP (PERSONAL CARE ITEMS) IMPLANT
SUT CHROMIC 0 CT 1 (SUTURE) IMPLANT
SUT MNCRL AB 4-0 PS2 18 (SUTURE) IMPLANT
SUT PLAIN 2 0 XLH (SUTURE) IMPLANT
SUT VIC AB 0 CT1 36 (SUTURE) IMPLANT
SUT VIC AB 1 CT1 36 (SUTURE) IMPLANT
SUT VIC AB 3-0 SH 27 (SUTURE)
SUT VIC AB 3-0 SH 27X BRD (SUTURE) IMPLANT

## 2014-11-05 NOTE — Lactation Note (Signed)
This note was copied from the chart of Renee Barnabas HarriesMegan Strollo.  Lactation Consultation Note  Patient Name: Renee Potter ZOXWR'UToday's Date: 11/05/2014 Reason for consult: Initial assessment   Maternal Data  mother has larger pendulous breasts and the nipples are everted. Baby was able to go to the breast and fed well on the left in cross cradle and then the right in cross cradle with audible sucks and swallows.  Feeding Feeding Type: Breast Fed Length of feed: 30 min  LATCH Score/Interventions Latch: Repeated attempts needed to sustain latch, nipple held in mouth throughout feeding, stimulation needed to elicit sucking reflex. Intervention(s): Adjust position;Breast compression;Assist with latch  Audible Swallowing: A few with stimulation  Type of Nipple: Everted at rest and after stimulation  Comfort (Breast/Nipple): Soft / non-tender     Hold (Positioning): Full assist, staff holds infant at breast Intervention(s): Breastfeeding basics reviewed;Support Pillows  LATCH Score: 6  Lactation Tools Discussed/Used     Consult Status      Trudee GripCarolyn P Janaiah Vetrano 11/05/2014, 2:35 PM

## 2014-11-05 NOTE — Procedures (Addendum)
Operative Delivery Note At 1:13 a.m. a viable female was delivered via vacuum assisted vaginal delivery.  Presentation: vertex; Position: Right,, Occiput,, Anterior; Station: +2.  Complications: Shoulder dystocia, relieved by McRoberts maneuver, suprapubic pressure, and delivery of posterior arm. Two pop-offs of vacuum.   Delivery of the head: 11/05/2014  1:12 AM First maneuver: 11/05/2014  1:12 AM, McRoberts Second maneuver: 11/05/2014  1:12 AM, Suprapubic Pressure Third maneuver: 11/05/2014  1:13 AM,   Fourth maneuver: ,   Fifth maneuver: ,   Sixth maneuver: ,    Verbal consent: obtained from patient.  Risks and benefits discussed in detail.  Risks include, but are not limited to the risks of anesthesia, bleeding, infection, damage to maternal tissues, fetal cephalhematoma.  There is also the risk of inability to effect vaginal delivery of the head, or shoulder dystocia that cannot be resolved by established maneuvers, leading to the need for emergency cesarean section.   APGAR: 6, 8; weight 8 lb 3.9 oz (3739 g).   Placenta status: Intact, Spontaneous.   Cord: 3 vessels with the following complications: none.  Cord pH: pending.    Anesthesia:  Epidural Instruments: Kiwi cup vacuum Episiotomy:  No Lacerations:   None Suture Repair: None Est. Blood Loss (mL): 600 ml.  Patient lost IV access during delivery, given 20 mIU of Pitocin IM, and Cytotec 800 mcg PR.   Mom to postpartum.  Baby to Nursery.  Saxton Chain 11/05/2014, 1:50 AM

## 2014-11-06 ENCOUNTER — Encounter: Payer: Self-pay | Admitting: Obstetrics and Gynecology

## 2014-11-06 DIAGNOSIS — Z862 Personal history of diseases of the blood and blood-forming organs and certain disorders involving the immune mechanism: Secondary | ICD-10-CM

## 2014-11-06 DIAGNOSIS — Z8759 Personal history of other complications of pregnancy, childbirth and the puerperium: Secondary | ICD-10-CM

## 2014-11-06 LAB — CBC WITH DIFFERENTIAL/PLATELET
Basophils Absolute: 0 10*3/uL (ref 0–0.1)
Basophils Relative: 0 %
EOS PCT: 2 %
Eosinophils Absolute: 0.2 10*3/uL (ref 0–0.7)
HEMATOCRIT: 23.1 % — AB (ref 35.0–47.0)
Hemoglobin: 7.7 g/dL — ABNORMAL LOW (ref 12.0–16.0)
LYMPHS PCT: 16 %
Lymphs Abs: 2 10*3/uL (ref 1.0–3.6)
MCH: 29.2 pg (ref 26.0–34.0)
MCHC: 33.4 g/dL (ref 32.0–36.0)
MCV: 87.5 fL (ref 80.0–100.0)
MONO ABS: 1 10*3/uL — AB (ref 0.2–0.9)
MONOS PCT: 8 %
NEUTROS ABS: 9.4 10*3/uL — AB (ref 1.4–6.5)
NEUTROS PCT: 74 %
Platelets: 232 10*3/uL (ref 150–440)
RBC: 2.64 MIL/uL — ABNORMAL LOW (ref 3.80–5.20)
RDW: 13.9 % (ref 11.5–14.5)
WBC: 12.7 10*3/uL — AB (ref 3.6–11.0)
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
## 2014-11-06 NOTE — Anesthesia Postprocedure Evaluation (Signed)
  Anesthesia Post-op Note  Patient: Iona BeardMegan D Weckwerth  Procedure(s) Performed: * No procedures listed *  Anesthesia type: epidural  Patient location: 342 a  Post pain: Pain level controlled  Post assessment: Post-op Vital signs reviewed, Patient's Cardiovascular Status Stable, Respiratory Function Stable, Patent Airway and No signs of Nausea or vomiting  Post vital signs: Reviewed and stable  Last Vitals:  Filed Vitals:   11/05/14 2332  BP: 101/38  Pulse: 92  Temp: 36.6 C  Resp: 18    Level of consciousness: awake, alert  and patient cooperative  Complications: No apparent anesthesia complications

## 2014-11-06 NOTE — Anesthesia Post-op Follow-up Note (Signed)
  Anesthesia Pain Follow-up Note  Patient: Renee Potter  Day #: 1  Date of Follow-up: 11/06/2014 Time: 7:27 AM  Last Vitals:  Filed Vitals:   11/05/14 2332  BP: 101/38  Pulse: 92  Temp: 36.6 C  Resp: 18    Level of Consciousness: alert  Pain: none   Side Effects:None  Catheter Site Exam:clean, dry, no drainage  Plan: D/C from anesthesia care  Chong SicilianLopez,  Sharol Croghan

## 2014-11-06 NOTE — Progress Notes (Signed)
Post Partum Day #1 (VAVD)  Subjective: no complaints, up ad lib, voiding and tolerating PO  Objective: Temp:  [97.4 F (36.3 C)-98.4 F (36.9 C)] 97.6 F (36.4 C) (07/15 1133) Pulse Rate:  [92-113] 101 (07/15 0700) Resp:  [18] 18 (07/15 0700) BP: (101-126)/(38-74) 126/74 mmHg (07/15 0700) SpO2:  [99 %-100 %] 99 % (07/15 0700)  Physical Exam:  General: alert, cooperative and no distress Lochia: appropriate Uterine Fundus: firm Incision: none DVT Evaluation: No evidence of DVT seen on physical exam. No cords or calf tenderness. No significant calf/ankle edema.   Recent Labs  11/04/14 0635 11/05/14 0552  HGB 12.2 9.3*  HCT 36.2 28.2*    Assessment/Plan: Anemia - secondary to PPH at delivery. Patient will need iron supplementation PO BID.  Needs CBC for Day 1.  Chorioamnionitis - patient febrile during 2nd stage of labor. S/p antibiotics x 24 hrs. Has been afebrile since delivery.  Tachycardia, persistent - Has resolved since delivery. No need for further workup at this time.  Plan for discharge tomorrow, Breastfeeding, Lactation consult and Contraception undecided  Desires circumcision for female infant, will be performed as outpatient.    LOS: 2 days   Hildred Lasernika Maryhelen Lindler 11/06/2014, 1:08 PM

## 2014-11-07 MED ORDER — IBUPROFEN 800 MG PO TABS: 800.0000 mg | ORAL_TABLET | Freq: Three times a day (TID) | ORAL | Status: DC | PRN

## 2014-11-07 MED ORDER — DOCUSATE SODIUM 100 MG PO CAPS: 100.0000 mg | ORAL_CAPSULE | Freq: Two times a day (BID) | ORAL | Status: AC | PRN

## 2014-11-07 MED ORDER — FERROUS SULFATE 325 (65 FE) MG PO TABS: 325.0000 mg | ORAL_TABLET | Freq: Three times a day (TID) | ORAL | Status: DC

## 2014-11-07 NOTE — Progress Notes (Signed)
Post Partum Day #2 (VAVD)  Subjective: no complaints, up ad lib, voiding and tolerating PO.  Notes difficulty with latching while breastfeeding, but is pumping.   Objective: Temp:  [97.7 F (36.5 C)-98.4 F (36.9 C)] 97.8 F (36.6 C) (07/16 1143) Pulse Rate:  [90-106] 102 (07/16 1143) Resp:  [16-20] 20 (07/16 1143) BP: (123-144)/(50-79) 144/79 mmHg (07/16 1143) SpO2:  [100 %] 100 % (07/16 1143)  Physical Exam:  General: alert, cooperative and no distress Lochia: appropriate Uterine Fundus: firm Incision: none DVT Evaluation: No evidence of DVT seen on physical exam. No cords or calf tenderness. No significant calf/ankle edema.   Recent Labs  11/05/14 0552 11/06/14 1421  HGB 9.3* 7.7*  HCT 28.2* 23.1*    Assessment/Plan: Anemia - secondary to PPH at delivery. Patient will need iron supplementation PO TID.   Chorioamnionitis - patient febrile during 2nd stage of labor. S/p antibiotics x 24 hrs. Has been afebrile since delivery.  Tachycardia - Has resolved since delivery. No need for further workup at this time.  GHTN - labile BPs, does not require meds at this time. Will f/u in 1 week for BP check.  Plan for discharge tomorrow, Breastfeeding and Contraception undecided. Desires circumcision for female infant, will be performed as outpatient.    LOS: 3 days   Renee Potter 11/07/2014, 12:01 PM

## 2014-11-07 NOTE — Discharge Instructions (Signed)

## 2014-11-07 NOTE — Progress Notes (Signed)
`                                                                                                                                                                                                                                                                                                                                                                                                                                                                                                                                                                                                                                                                                                                                                                                                                                                                                                                                                                                                                                                                                                                                                                                                                                                                                                                                                                                                                                                                                                                                                                                                                                                                                                                        Discharged to boarding status

## 2014-11-07 NOTE — Discharge Summary (Signed)
Obstetric Discharge Summary Reason for Admission: onset of labor and gestational HTN Prenatal Procedures: NST and ultrasound Intrapartum Procedures: vacuum and GBS prophylaxis Postpartum Procedures: antibiotics Complications-Operative and Postpartum: hemorrhage  CBC Latest Ref Rng 11/06/2014 11/05/2014 11/04/2014  WBC 3.6 - 11.0 K/uL 12.7(H) 27.6(H) 21.0(H)  Hemoglobin 12.0 - 16.0 g/dL 7.7(L) 9.3(L) 12.2  Hematocrit 35.0 - 47.0 % 23.1(L) 28.2(L) 36.2  Platelets 150 - 440 K/uL 232 282 266     Physical Exam:  General: alert and no distress Lochia: appropriate Uterine Fundus: firm Incision: none DVT Evaluation: No evidence of DVT seen on physical exam. No cords or calf tenderness. No significant calf/ankle edema.  Discharge Diagnoses: Term Pregnancy-delivered, Amnionitis, Post-date pregnancy and Gestational HTN  Discharge Information: Date: 11/07/2014 Activity: pelvic rest Diet: routine Medications: PNV, Ibuprofen, Colace and Iron Condition: stable Instructions: refer to practice specific booklet Discharge to: home   Newborn Data: Live born female  Birth Weight: 8 lb 3.9 oz (3739 g) APGAR: 6, 8  Home with mother.  Renee Potter 11/07/2014, 12:04 PM

## 2015-03-07 IMAGING — US US PELV - US TRANSVAGINAL
1 series · 14 of 25 positions shown · non-contrast
Comparison: none

REASON FOR EXAM: adnexal tenderness, R>L
COMMENTS:

PROCEDURE:     US  - US PELVIS EXAM W/TRANSVAGINAL  - November 01, 2012 [DATE]
RESULT:     Ultrasound dated 11/01/2012

[Series 1: us pelv - us transvaginal · 0.25mm/px · 14 of 94 slices shown]
[im 1/94]
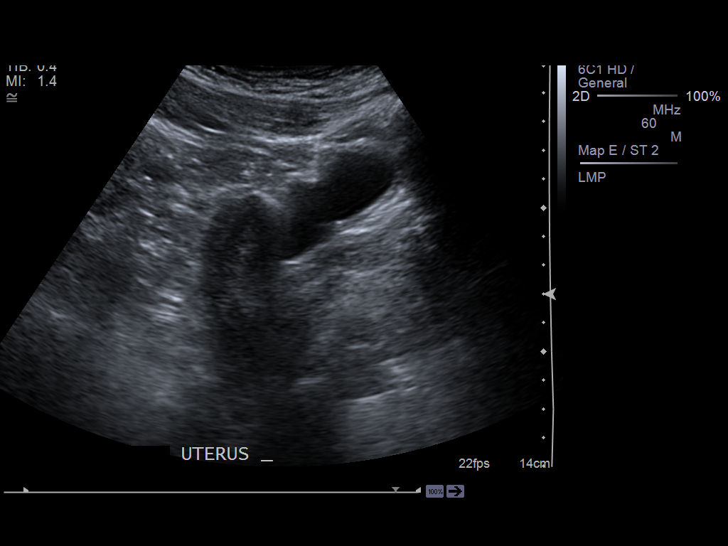
[im 8/94]
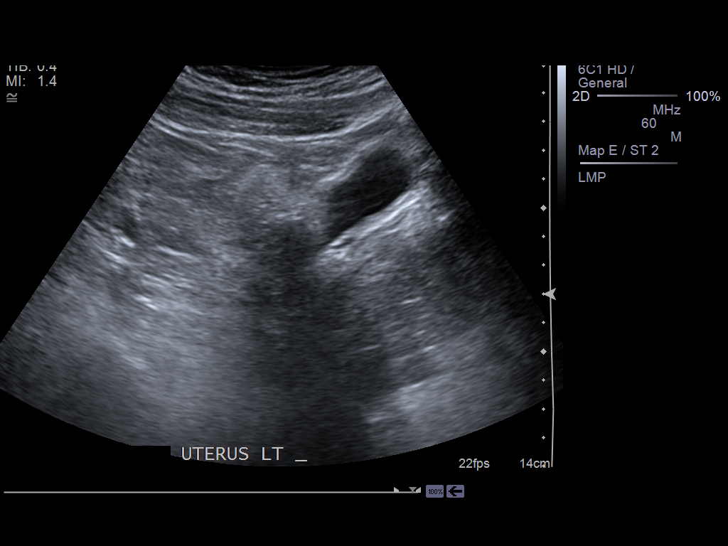
[im 16/94]
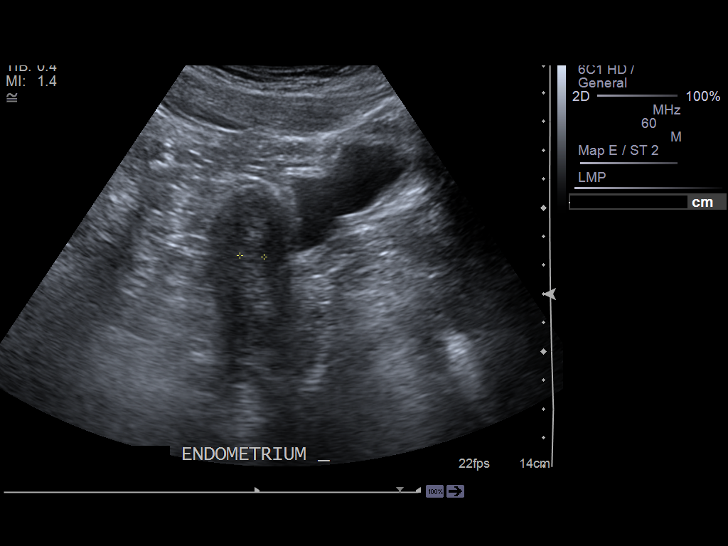
[im 24/94]
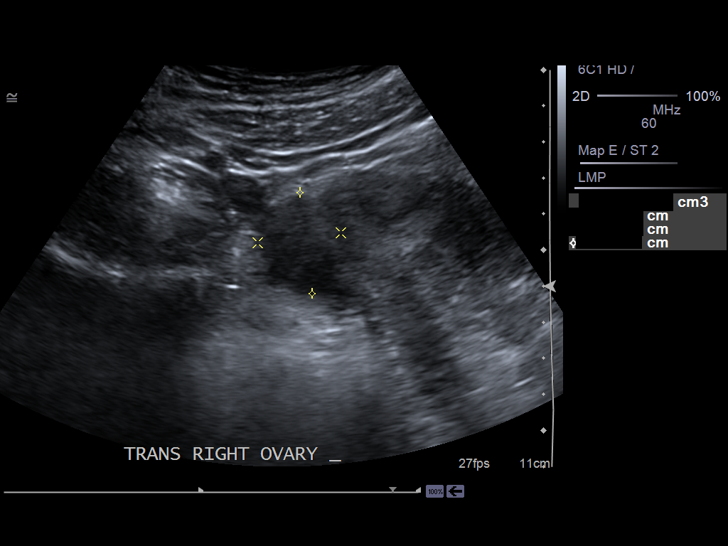
[im 32/94]
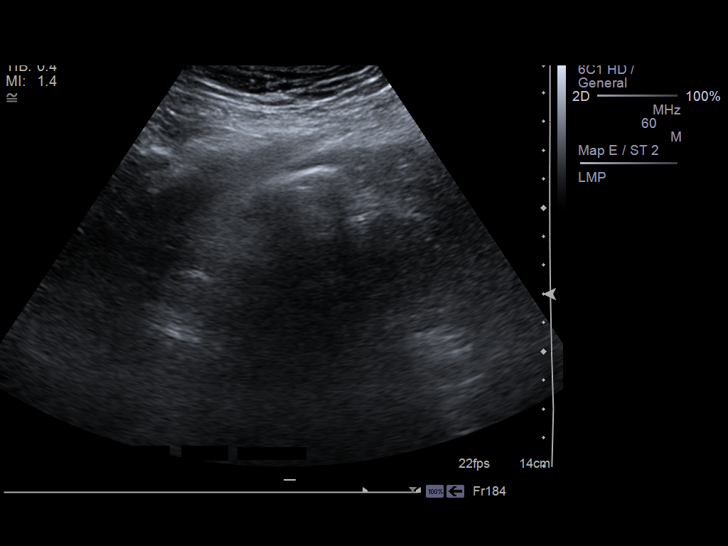
[im 35/94]
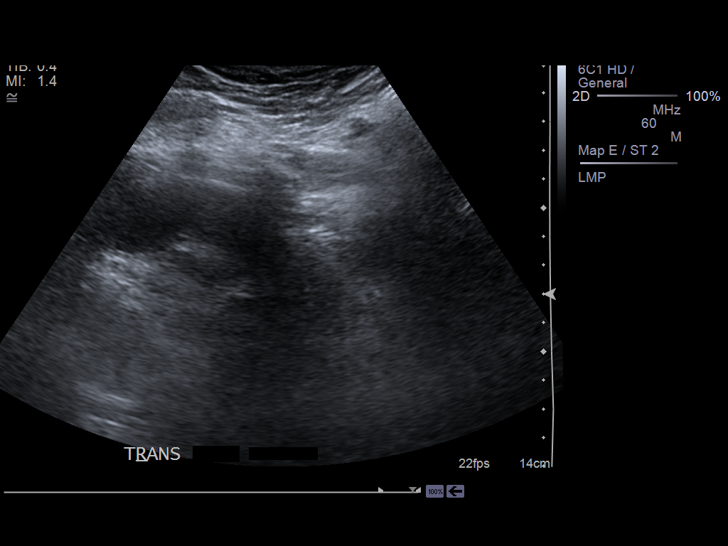
[im 43/94]
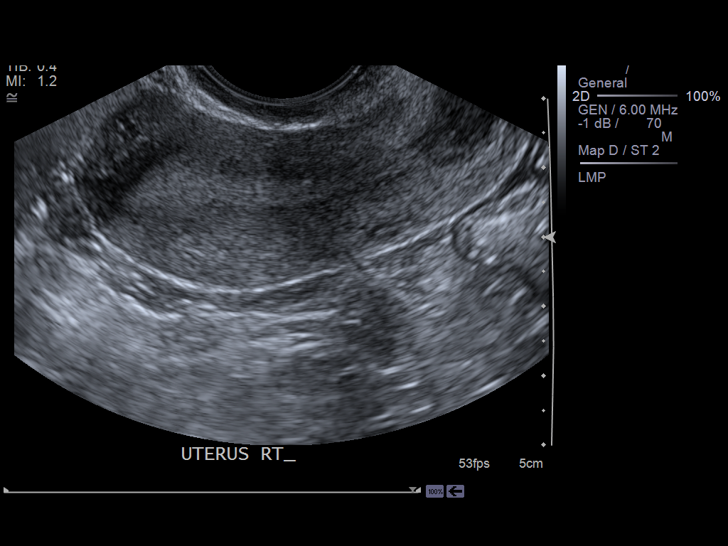
[im 51/94]
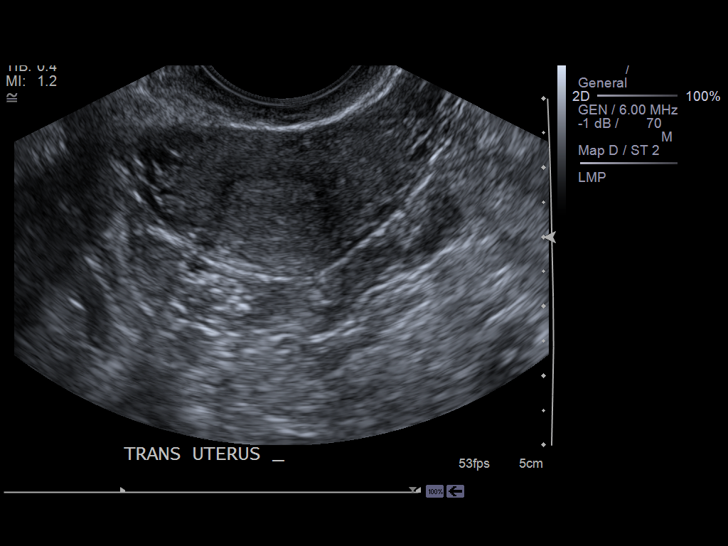
[im 59/94]
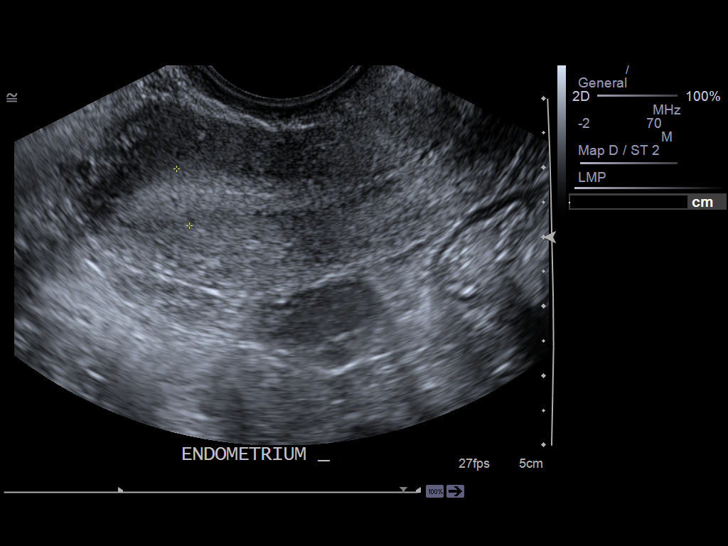
[im 63/94]
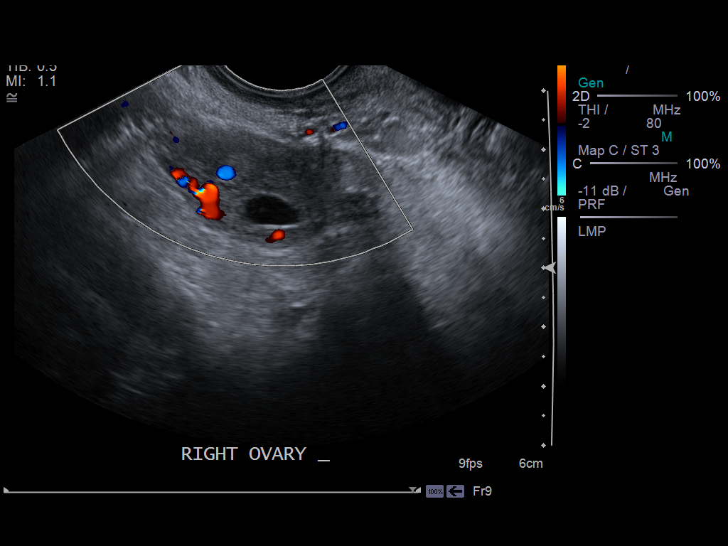
[im 70/94]
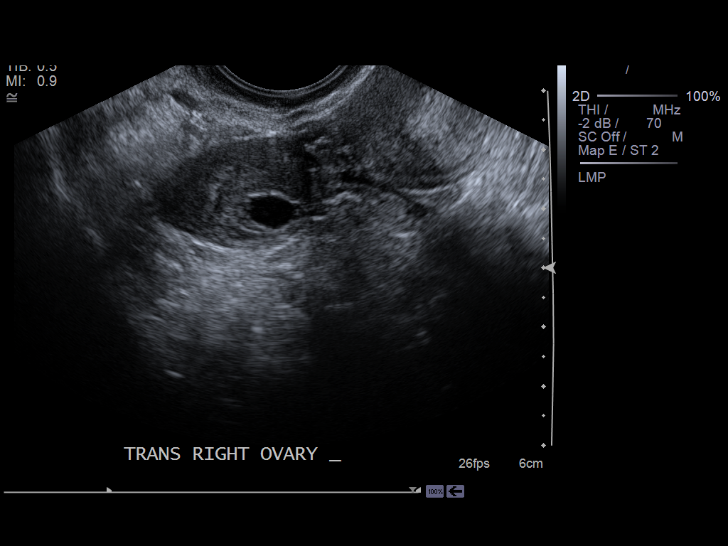
[im 78/94]
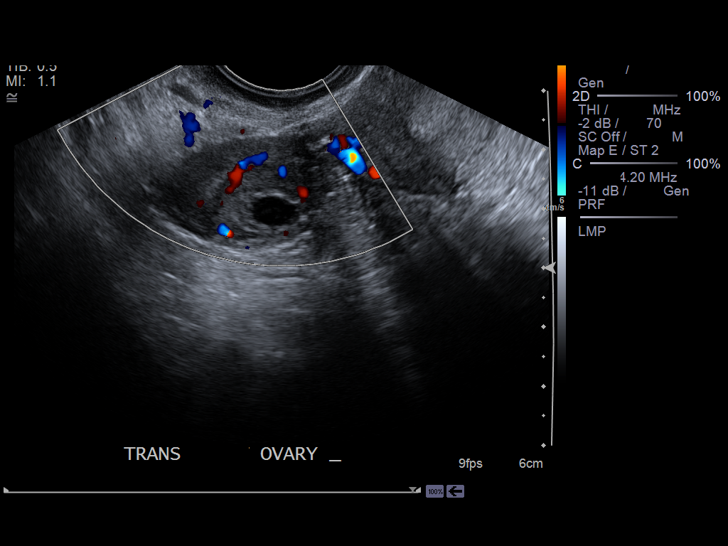
[im 86/94]
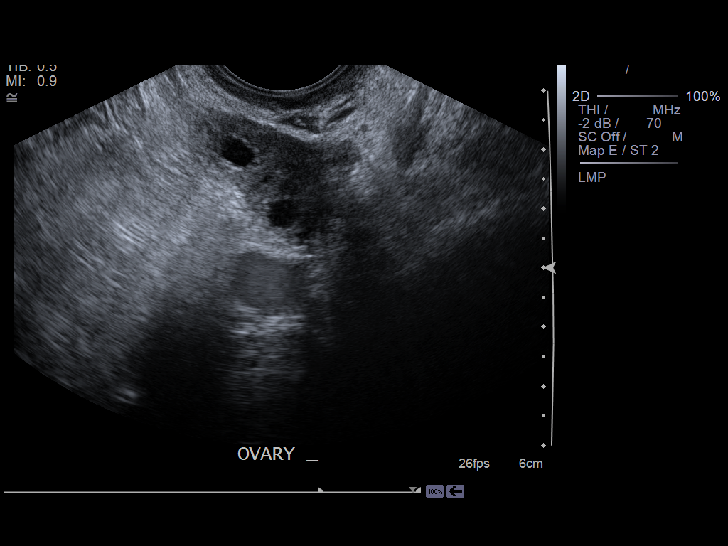
[im 94/94]
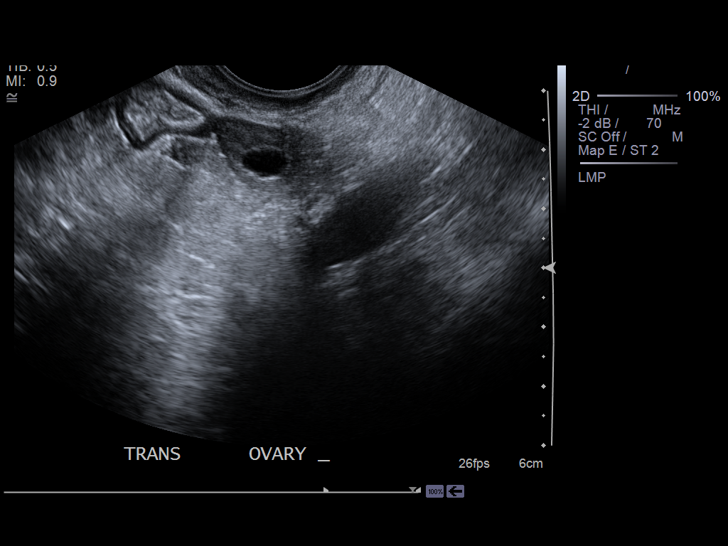

[14 of 25 positions shown; findings below may reference images not displayed]

FINDINGS: There are findings which raise concern of a septated uterus.
Nonemergent OB/GYN consultation recommended for with. The uterus otherwise
demonstrate homogeneous echotexture an endometrial thickness of 8.4 mm.
There is no evidence of pelvic free fluid or loculated fluid collections.
The right ovary measures 2.66 x 1.92 x 2.1 centimeters and the left 2.03 x
1.5 x 2.56 centimeters. Follicles identified within the right ovary. The
left ovary images homogeneous echotexture. Color filling of ovarian vessels
is appreciated. There is no evidence of hydronephrosis.
IMPRESSION: Findings raising suspicion of a septated uterus nonemergent
OB/GYN consultation recommended.
2. Otherwise no evidence of focal acute abnormalities.

## 2015-08-20 ENCOUNTER — Emergency Department
Admission: EM | Admit: 2015-08-20 | Discharge: 2015-08-20 | Disposition: A | Discharge status: Home / Self Care | Payer: Medicaid Other | Attending: Emergency Medicine | Admitting: Emergency Medicine

## 2015-08-20 DIAGNOSIS — Z6841 Body Mass Index (BMI) 40.0 and over, adult: Secondary | ICD-10-CM | POA: Diagnosis not present

## 2015-08-20 DIAGNOSIS — B373 Candidiasis of vulva and vagina: Secondary | ICD-10-CM | POA: Diagnosis not present

## 2015-08-20 DIAGNOSIS — Z87891 Personal history of nicotine dependence: Secondary | ICD-10-CM | POA: Insufficient documentation

## 2015-08-20 DIAGNOSIS — Z3A19 19 weeks gestation of pregnancy: Secondary | ICD-10-CM | POA: Diagnosis not present

## 2015-08-20 DIAGNOSIS — F329 Major depressive disorder, single episode, unspecified: Secondary | ICD-10-CM | POA: Diagnosis not present

## 2015-08-20 DIAGNOSIS — F1290 Cannabis use, unspecified, uncomplicated: Secondary | ICD-10-CM | POA: Diagnosis not present

## 2015-08-20 DIAGNOSIS — O98312 Other infections with a predominantly sexual mode of transmission complicating pregnancy, second trimester: Secondary | ICD-10-CM | POA: Diagnosis not present

## 2015-08-20 DIAGNOSIS — Z79899 Other long term (current) drug therapy: Secondary | ICD-10-CM | POA: Diagnosis not present

## 2015-08-20 DIAGNOSIS — R21 Rash and other nonspecific skin eruption: Secondary | ICD-10-CM | POA: Diagnosis present

## 2015-08-20 DIAGNOSIS — E669 Obesity, unspecified: Secondary | ICD-10-CM | POA: Diagnosis not present

## 2015-08-20 DIAGNOSIS — B372 Candidiasis of skin and nail: Secondary | ICD-10-CM

## 2015-08-20 MED ORDER — NYSTATIN 100000 UNIT/GM EX POWD: CUTANEOUS | Status: DC

## 2015-08-20 MED ORDER — FLUCONAZOLE 150 MG PO TABS: 150.0000 mg | ORAL_TABLET | Freq: Every day | ORAL | Status: DC
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
## 2015-08-20 NOTE — ED Notes (Signed)
Pt sts she began rocephin 4/19 and stopped taking it 3-4 days ago, stating that "I didn't know if I was having a allergic reaction.  Pts groin area red and sore in crevices.

## 2015-08-20 NOTE — ED Notes (Signed)
Rash to vaginal area X 4-5 days, itching. Pt alert and oriented X4, active, cooperative, pt in NAD. RR even and unlabored, color WNL.

## 2015-08-20 NOTE — Discharge Instructions (Signed)
Cutaneous Candidiasis °Cutaneous candidiasis is a condition in which there is an overgrowth of yeast (candida) on the skin. Yeast normally live on the skin, but in small enough numbers not to cause any symptoms. In certain cases, increased growth of the yeast may cause an actual yeast infection. This kind of infection usually occurs in areas of the skin that are constantly warm and moist, such as the armpits or the groin. Yeast is the most common cause of diaper rash in babies and in people who cannot control their bowel movements (incontinence). °CAUSES  °The fungus that most often causes cutaneous candidiasis is Candida albicans. Conditions that can increase the risk of getting a yeast infection of the skin include: °· Obesity. °· Pregnancy. °· Diabetes. °· Taking antibiotic medicine. °· Taking birth control pills. °· Taking steroid medicines. °· Thyroid disease. °· An iron or zinc deficiency. °· Problems with the immune system. °SYMPTOMS  °· Red, swollen area of the skin. °· Bumps on the skin. °· Itchiness. °DIAGNOSIS  °The diagnosis of cutaneous candidiasis is usually based on its appearance. Light scrapings of the skin may also be taken and viewed under a microscope to identify the presence of yeast. °TREATMENT  °Antifungal creams may be applied to the infected skin. In severe cases, oral medicines may be needed.  °HOME CARE INSTRUCTIONS  °· Keep your skin clean and dry. °· Maintain a healthy weight. °· If you have diabetes, keep your blood sugar under control. °SEEK IMMEDIATE MEDICAL CARE IF: °· Your rash continues to spread despite treatment. °· You have a fever, chills, or abdominal pain. °  °This information is not intended to replace advice given to you by your health care provider. Make sure you discuss any questions you have with your health care provider. °  °Document Released: 12/27/2010 Document Revised: 07/03/2011 Document Reviewed: 10/12/2014 °Elsevier Interactive Patient Education ©2016 Elsevier  Inc. ° °

## 2015-08-23 ENCOUNTER — Emergency Department
Admission: EM | Admit: 2015-08-23 | Discharge: 2015-08-23 | Disposition: A | Discharge status: Home / Self Care | Payer: Medicaid Other | Source: Home / Self Care | Attending: Emergency Medicine | Admitting: Emergency Medicine

## 2015-08-23 ENCOUNTER — Encounter: Payer: Self-pay | Admitting: Emergency Medicine

## 2015-08-23 ENCOUNTER — Emergency Department: Payer: Medicaid Other

## 2015-08-23 ENCOUNTER — Observation Stay
Admission: EM | Admit: 2015-08-23 | Discharge: 2015-08-23 | Disposition: A | Discharge status: Home / Self Care | Payer: Medicaid Other | Attending: Obstetrics and Gynecology | Admitting: Obstetrics and Gynecology

## 2015-08-23 DIAGNOSIS — Z3A20 20 weeks gestation of pregnancy: Secondary | ICD-10-CM | POA: Diagnosis not present

## 2015-08-23 DIAGNOSIS — Z79899 Other long term (current) drug therapy: Secondary | ICD-10-CM

## 2015-08-23 DIAGNOSIS — Z87891 Personal history of nicotine dependence: Secondary | ICD-10-CM

## 2015-08-23 DIAGNOSIS — Y9289 Other specified places as the place of occurrence of the external cause: Secondary | ICD-10-CM | POA: Insufficient documentation

## 2015-08-23 DIAGNOSIS — F329 Major depressive disorder, single episode, unspecified: Secondary | ICD-10-CM

## 2015-08-23 DIAGNOSIS — Z6841 Body Mass Index (BMI) 40.0 and over, adult: Secondary | ICD-10-CM | POA: Diagnosis not present

## 2015-08-23 DIAGNOSIS — Y998 Other external cause status: Secondary | ICD-10-CM | POA: Insufficient documentation

## 2015-08-23 DIAGNOSIS — F1290 Cannabis use, unspecified, uncomplicated: Secondary | ICD-10-CM | POA: Insufficient documentation

## 2015-08-23 DIAGNOSIS — E669 Obesity, unspecified: Secondary | ICD-10-CM | POA: Insufficient documentation

## 2015-08-23 DIAGNOSIS — W19XXXA Unspecified fall, initial encounter: Secondary | ICD-10-CM

## 2015-08-23 DIAGNOSIS — Z91410 Personal history of adult physical and sexual abuse: Secondary | ICD-10-CM | POA: Insufficient documentation

## 2015-08-23 DIAGNOSIS — W108XXA Fall (on) (from) other stairs and steps, initial encounter: Secondary | ICD-10-CM | POA: Insufficient documentation

## 2015-08-23 DIAGNOSIS — F4310 Post-traumatic stress disorder, unspecified: Secondary | ICD-10-CM | POA: Insufficient documentation

## 2015-08-23 DIAGNOSIS — Q513 Bicornate uterus: Secondary | ICD-10-CM | POA: Diagnosis not present

## 2015-08-23 DIAGNOSIS — O4690 Antepartum hemorrhage, unspecified, unspecified trimester: Secondary | ICD-10-CM | POA: Diagnosis present

## 2015-08-23 DIAGNOSIS — O3402 Maternal care for unspecified congenital malformation of uterus, second trimester: Secondary | ICD-10-CM | POA: Insufficient documentation

## 2015-08-23 DIAGNOSIS — O99342 Other mental disorders complicating pregnancy, second trimester: Secondary | ICD-10-CM | POA: Insufficient documentation

## 2015-08-23 DIAGNOSIS — O26852 Spotting complicating pregnancy, second trimester: Principal | ICD-10-CM | POA: Insufficient documentation

## 2015-08-23 DIAGNOSIS — O26892 Other specified pregnancy related conditions, second trimester: Secondary | ICD-10-CM | POA: Insufficient documentation

## 2015-08-23 DIAGNOSIS — O99212 Obesity complicating pregnancy, second trimester: Secondary | ICD-10-CM | POA: Insufficient documentation

## 2015-08-23 DIAGNOSIS — Y9389 Activity, other specified: Secondary | ICD-10-CM | POA: Insufficient documentation

## 2015-08-23 LAB — HCG, QUANTITATIVE, PREGNANCY: hCG, Beta Chain, Quant, S: 13880 m[IU]/mL — ABNORMAL HIGH (ref <5)

## 2015-08-23 MED ORDER — NYSTATIN 100000 UNIT/GM EX POWD: CUTANEOUS | Status: AC

## 2015-08-23 MED ORDER — LIDOCAINE HCL (PF) 1 % IJ SOLN: 30.0000 mL | INTRAMUSCULAR | Status: DC | PRN

## 2015-08-23 NOTE — Discharge Instructions (Signed)
Dr. Vergie LivingPickens would like to evaluate you at labor and delivery, so we will be bringing you there to see him today at our hospital.  Please return to the emergency room right away if you are to develop heavy bleeding, "contractions" or abdominal pain, a fever, severe nausea, your pain becomes severe or worsens, you are unable to keep food down, begin vomiting any dark or bloody fluid, you develop any dark or bloody stools, feel dehydrated, or other new concerns or symptoms arise.

## 2015-08-23 NOTE — ED Notes (Signed)
Reports fell last night, today had bright red blood in the toilet.  Denies pain or active bleeding now.

## 2015-08-23 NOTE — ED Notes (Signed)
FHT 140
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
## 2015-08-23 NOTE — OB Triage Note (Signed)
Patient presented from the ED complaining of a vaginal bleeding episode this morning around 0200.  Patient states she hasn't had any vaginal bleeding since that time,  Also states fell yesterday afternoon around 2000.  Patient denies any current vaginal bleeding, leaking of fluid, contractions or recent intercourse.  States she has been feeling the baby move but its less that usual.
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
## 2015-08-26 NOTE — Discharge Summary (Signed)
  See FPN 

## 2016-04-24 NOTE — L&D Delivery Note (Addendum)
Delivery Summary for Iona BeardMegan D Lietz  Labor Events:   Preterm labor:   Rupture date:   Rupture time:   Rupture type: Artificial  Fluid Color: Moderate Meconium  Induction:   Augmentation:   Complications:   Cervical ripening:          Delivery:   Episiotomy:   Lacerations:   Repair suture:   Repair # of packets:   Blood loss (ml): 300   Information for the patient's newborn:  Marlan PalauJudy, Girl Audriana [[id number]][[id number]]    Delivery 04/17/2017 12:58 AM by  Vaginal, Spontaneous Sex:  female Gestational Age: 1243w3d Delivery Clinician:   Living?:         APGARS  One minute Five minutes Ten minutes  Skin color:        Heart rate:        Grimace:        Muscle tone:        Breathing:        Totals: 7  9      Presentation/position:      Resuscitation:   Cord information:    Disposition of cord blood:     Blood gases sent?  Complications:   Placenta: Delivered:       appearance Newborn Measurements: Weight: 8 lb 10.3 oz (3920 g)  Height: 20.47"  Head circumference:    Chest circumference:    Other providers:    Additional  information: Forceps:   Vacuum:   Breech:   Observed anomalies       Operative Delivery Note At 12:58 AM a viable and healthy female was delivered via Vaginal, Spontaneous.  Presentation: compound; Position: Left,, Occiput,, Anterior; Station: +3.  Labor was precipitous (less than 1 hour).   Delayed cord clamping not performed.   Delivery of the head:  First maneuver: 04/17/2017 12:58 AM, McRoberts Second maneuver: 04/17/2017 12:58 AM, Suprapubic Pressure Third maneuver: ,   Fourth maneuver: ,   Fifth maneuver: ,   Sixth maneuver: ,    Verbal consent: obtained from patient.  APGAR: 7, 9; weight 8 lb 10.3 oz (3920 g).   Placenta status: spontaneously removed, intact.   Cord:  3-vessel, with the following complications: None.  Cord pH: not obtained.   Anesthesia: IM pain medication Episiotomy: None Lacerations: Left periurethral, hemostatic Suture  Repair: None Est. Blood Loss (mL):  300  Mom to postpartum.  Baby to Couplet care / Skin to Skin.  Hildred Lasernika Frederich Montilla 04/17/2017, 1:47 AM

## 2016-06-27 ENCOUNTER — Encounter (HOSPITAL_COMMUNITY): Payer: Self-pay

## 2016-12-07 LAB — OB RESULTS CONSOLE HEPATITIS B SURFACE ANTIGEN: HEP B S AG: NEGATIVE

## 2016-12-07 LAB — OB RESULTS CONSOLE RUBELLA ANTIBODY, IGM: RUBELLA: IMMUNE

## 2016-12-07 LAB — OB RESULTS CONSOLE RPR: RPR: NONREACTIVE

## 2016-12-07 LAB — OB RESULTS CONSOLE HGB/HCT, BLOOD: HEMATOCRIT: 33

## 2016-12-07 LAB — OB RESULTS CONSOLE PLATELET COUNT: PLATELETS: 263

## 2017-02-28 ENCOUNTER — Other Ambulatory Visit: Payer: Self-pay | Admitting: Nurse Practitioner

## 2017-02-28 DIAGNOSIS — Z3483 Encounter for supervision of other normal pregnancy, third trimester: Secondary | ICD-10-CM

## 2017-02-28 LAB — OB RESULTS CONSOLE HIV ANTIBODY (ROUTINE TESTING): HIV: NONREACTIVE

## 2017-03-01 LAB — OB RESULTS CONSOLE HGB/HCT, BLOOD: HEMOGLOBIN: 12

## 2017-03-03 LAB — OB RESULTS CONSOLE GC/CHLAMYDIA: Chlamydia: NEGATIVE

## 2017-03-06 ENCOUNTER — Ambulatory Visit
Admission: RE | Admit: 2017-03-06 | Discharge: 2017-03-06 | Disposition: A | Discharge status: Home / Self Care | Payer: Medicaid Other | Source: Ambulatory Visit | Attending: Nurse Practitioner | Admitting: Nurse Practitioner

## 2017-03-06 DIAGNOSIS — Z3483 Encounter for supervision of other normal pregnancy, third trimester: Secondary | ICD-10-CM | POA: Diagnosis present

## 2017-03-26 ENCOUNTER — Encounter: Payer: Self-pay | Admitting: *Deleted

## 2017-03-26 ENCOUNTER — Other Ambulatory Visit: Payer: Self-pay

## 2017-03-26 ENCOUNTER — Inpatient Hospital Stay
Admission: EM | Admit: 2017-03-26 | Discharge: 2017-03-26 | Disposition: A | Discharge status: Home / Self Care | Payer: Medicaid Other | Attending: Obstetrics and Gynecology | Admitting: Obstetrics and Gynecology

## 2017-03-26 DIAGNOSIS — O26833 Pregnancy related renal disease, third trimester: Secondary | ICD-10-CM | POA: Insufficient documentation

## 2017-03-26 DIAGNOSIS — N049 Nephrotic syndrome with unspecified morphologic changes: Secondary | ICD-10-CM | POA: Insufficient documentation

## 2017-03-26 DIAGNOSIS — Z3A39 39 weeks gestation of pregnancy: Secondary | ICD-10-CM | POA: Diagnosis not present

## 2017-03-26 LAB — URINE DRUG SCREEN, QUALITATIVE (ARMC ONLY)
Amphetamines, Ur Screen: NOT DETECTED
Barbiturates, Ur Screen: NOT DETECTED
Benzodiazepine, Ur Scrn: NOT DETECTED
CANNABINOID 50 NG, UR ~~LOC~~: NOT DETECTED
Cocaine Metabolite,Ur ~~LOC~~: NOT DETECTED
MDMA (ECSTASY) UR SCREEN: NOT DETECTED
Methadone Scn, Ur: NOT DETECTED
Opiate, Ur Screen: NOT DETECTED
PHENCYCLIDINE (PCP) UR S: NOT DETECTED
Tricyclic, Ur Screen: NOT DETECTED

## 2017-03-26 NOTE — Discharge Summary (Signed)
    L&D OB Triage Note  SUBJECTIVE Renee Potter is a 21 y.o. 823P2002 female at 2633w2d, EDD Estimated Date of Delivery: 04/21/17.  [facility].  She presented late prenatal care and has only had a few visits.  At her last prenatal visit a 24-hour urine was obtained because she had proteinuria.  This showed significant proteinuria and she was contacted and told to present to labor and delivery on Friday.  She now comes in today for that visit.  She denies contractions, vaginal bleeding or other problems.    Obstetric History   G3   P2   T2   P0   A0   L2    SAB0   TAB0   Ectopic0   Multiple0   Live Births2     # Outcome Date GA Lbr Len/2nd Weight Sex Delivery Anes PTL Lv  3 Current           2 Term 01/09/16 551w6d    Vag-Spont   LIV  1 Term 11/05/14 6672w2d 09:44 / 05:29 8 lb 3.9 oz (3.739 kg) M Vag-Vacuum EPI  LIV     Apgar1:  6                Apgar5: 8      Medications Prior to Admission  Medication Sig Dispense Refill Last Dose  . Prenatal Vit-Fe Fumarate-FA (PRENATAL MULTIVITAMIN) TABS tablet Take 1 tablet by mouth daily at 12 noon.   03/26/2017 at Unknown time  . docusate sodium (COLACE) 100 MG capsule Take 1 capsule (100 mg total) by mouth 2 (two) times daily as needed. (Patient not taking: Reported on 03/26/2017) 30 capsule 2 Not Taking at Unknown time  . ferrous sulfate 325 (65 FE) MG tablet Take 1 tablet (325 mg total) by mouth 3 (three) times daily with meals. (Patient not taking: Reported on 03/26/2017) 90 tablet 3 Not Taking at Unknown time  . nystatin (NYSTATIN) powder Apply thin layer to affected area up to 2 times daily as needed. (Patient not taking: Reported on 03/26/2017) 45 g 1 Not Taking at Unknown time  . ranitidine (ZANTAC) 150 MG tablet Take 150 mg by mouth 2 (two) times daily.   Not Taking at Unknown time     OBJECTIVE  Nursing Evaluation:   BP (!) 113/56   Pulse 94   Temp 98.5 F (36.9 C) (Oral)    Resp 18   Ht 5\' 2"  (1.575 m)   Wt 253 lb (114.8 kg)   BMI 46.27 kg/m    Findings: Normal reflexes without clonus  NST was performed and has been reviewed by me.  NST INTERPRETATION: Category I  Mode: External Baseline Rate (A): 130 bpm Variability: Moderate Accelerations: 15 x 15 Decelerations: None     Contraction Frequency (min): none  ASSESSMENT Impression:  1.  Pregnancy:  [id number] at 7733w2d , EDD Estimated Date of Delivery: 04/21/17 2.  NST:  reactive 3.  Proteinuria without hypertension.  (Nephrotic syndrome?) 4.  Poor criteria for estimated gestational age dating -patient possibly between 5833 and [redacted] weeks gestation.  PLAN 1. Reassurance given 2. Discharge home with standard labor precautions given to return to L&D or call the office for problems. 3. [facility]. 4. Obtain toxicology screen 5. Follow closely for hypertension and perform appropriate workup as necessary.

## 2017-03-26 NOTE — Discharge Instructions (Signed)
Come back if: ° °Big gush of fluids °Decreased fetal movement °Temp over 100.4 °Heavy vaginal bleeding °Contractions every 3-5 min lasting at least one hour ° °Get plenty of rest and stay well hydrated! °

## 2017-03-26 NOTE — OB Triage Note (Signed)
G3/P2, [facility] with reports of high blood pressure and abnormal 24 hour urine lab results. Denies pain. Denies vaginal bleeding/discharge. Positive fetal movement. Monitors applied/assessing

## 2017-04-16 ENCOUNTER — Other Ambulatory Visit: Payer: Self-pay

## 2017-04-16 ENCOUNTER — Inpatient Hospital Stay
Admission: EM | Admit: 2017-04-16 | Discharge: 2017-04-19 | DRG: 807 | Disposition: A | Discharge status: Home / Self Care | Payer: Medicaid Other | Attending: Obstetrics and Gynecology | Admitting: Obstetrics and Gynecology

## 2017-04-16 DIAGNOSIS — Z720 Tobacco use: Secondary | ICD-10-CM | POA: Diagnosis present

## 2017-04-16 DIAGNOSIS — O09293 Supervision of pregnancy with other poor reproductive or obstetric history, third trimester: Secondary | ICD-10-CM

## 2017-04-16 DIAGNOSIS — O09899 Supervision of other high risk pregnancies, unspecified trimester: Secondary | ICD-10-CM

## 2017-04-16 DIAGNOSIS — Z8759 Personal history of other complications of pregnancy, childbirth and the puerperium: Secondary | ICD-10-CM

## 2017-04-16 DIAGNOSIS — Q513 Bicornate uterus: Secondary | ICD-10-CM

## 2017-04-16 DIAGNOSIS — O99214 Obesity complicating childbirth: Secondary | ICD-10-CM | POA: Diagnosis present

## 2017-04-16 DIAGNOSIS — Z87891 Personal history of nicotine dependence: Secondary | ICD-10-CM

## 2017-04-16 DIAGNOSIS — O3403 Maternal care for unspecified congenital malformation of uterus, third trimester: Secondary | ICD-10-CM | POA: Diagnosis present

## 2017-04-16 DIAGNOSIS — Z8659 Personal history of other mental and behavioral disorders: Secondary | ICD-10-CM

## 2017-04-16 DIAGNOSIS — Z3A39 39 weeks gestation of pregnancy: Secondary | ICD-10-CM

## 2017-04-16 DIAGNOSIS — Z862 Personal history of diseases of the blood and blood-forming organs and certain disorders involving the immune mechanism: Secondary | ICD-10-CM

## 2017-04-16 LAB — URINALYSIS, ROUTINE W REFLEX MICROSCOPIC
BILIRUBIN URINE: NEGATIVE
GLUCOSE, UA: NEGATIVE mg/dL
Hgb urine dipstick: NEGATIVE
KETONES UR: NEGATIVE mg/dL
LEUKOCYTES UA: NEGATIVE
Nitrite: NEGATIVE
PROTEIN: 100 mg/dL — AB
Specific Gravity, Urine: 1.02 (ref 1.005–1.030)
pH: 6 (ref 5.0–8.0)

## 2017-04-16 LAB — URINE DRUG SCREEN, QUALITATIVE (ARMC ONLY)
AMPHETAMINES, UR SCREEN: NOT DETECTED
BARBITURATES, UR SCREEN: NOT DETECTED
BENZODIAZEPINE, UR SCRN: NOT DETECTED
CANNABINOID 50 NG, UR ~~LOC~~: NOT DETECTED
Cocaine Metabolite,Ur ~~LOC~~: NOT DETECTED
MDMA (Ecstasy)Ur Screen: NOT DETECTED
Methadone Scn, Ur: NOT DETECTED
Opiate, Ur Screen: NOT DETECTED
PHENCYCLIDINE (PCP) UR S: NOT DETECTED
Tricyclic, Ur Screen: NOT DETECTED

## 2017-04-16 MED ORDER — LACTATED RINGERS IV BOLUS (SEPSIS)
1000.0000 mL | Freq: Once | INTRAVENOUS | Status: AC
  Administered 2017-04-16: 1000 mL via INTRAVENOUS

## 2017-04-16 MED ORDER — MORPHINE SULFATE (PF) 4 MG/ML IV SOLN
8.0000 mg | INTRAVENOUS | Status: DC | PRN
  Administered 2017-04-16: 8 mg via INTRAMUSCULAR
  Filled 2017-04-16: qty 2

## 2017-04-16 MED ORDER — PROMETHAZINE HCL 25 MG/ML IJ SOLN
25.0000 mg | Freq: Four times a day (QID) | INTRAMUSCULAR | Status: DC | PRN
  Administered 2017-04-16: 25 mg via INTRAMUSCULAR
  Filled 2017-04-16: qty 1

## 2017-04-16 NOTE — OB Triage Note (Signed)
Pt arrived to unit complaining of ctx starting yesterday evening (04/15/17) that were increasing in strength and frequency today. Denies bleeding, leaking of fluid, headache, blurred vision or other complaints. +FM.
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
## 2017-04-17 DIAGNOSIS — Z8759 Personal history of other complications of pregnancy, childbirth and the puerperium: Secondary | ICD-10-CM

## 2017-04-17 DIAGNOSIS — Z720 Tobacco use: Secondary | ICD-10-CM | POA: Diagnosis present

## 2017-04-17 DIAGNOSIS — Q513 Bicornate uterus: Secondary | ICD-10-CM | POA: Diagnosis not present

## 2017-04-17 DIAGNOSIS — Z8659 Personal history of other mental and behavioral disorders: Secondary | ICD-10-CM

## 2017-04-17 DIAGNOSIS — Z654 Victim of crime and terrorism: Secondary | ICD-10-CM | POA: Insufficient documentation

## 2017-04-17 DIAGNOSIS — Z3483 Encounter for supervision of other normal pregnancy, third trimester: Secondary | ICD-10-CM | POA: Diagnosis present

## 2017-04-17 DIAGNOSIS — O09899 Supervision of other high risk pregnancies, unspecified trimester: Secondary | ICD-10-CM

## 2017-04-17 DIAGNOSIS — O99214 Obesity complicating childbirth: Secondary | ICD-10-CM | POA: Diagnosis present

## 2017-04-17 DIAGNOSIS — Z3A39 39 weeks gestation of pregnancy: Secondary | ICD-10-CM | POA: Diagnosis not present

## 2017-04-17 DIAGNOSIS — O09293 Supervision of pregnancy with other poor reproductive or obstetric history, third trimester: Secondary | ICD-10-CM

## 2017-04-17 DIAGNOSIS — Z87891 Personal history of nicotine dependence: Secondary | ICD-10-CM | POA: Diagnosis not present

## 2017-04-17 DIAGNOSIS — O3403 Maternal care for unspecified congenital malformation of uterus, third trimester: Secondary | ICD-10-CM | POA: Diagnosis present

## 2017-04-17 LAB — CHLAMYDIA/NGC RT PCR (ARMC ONLY)
Chlamydia Tr: NOT DETECTED
N gonorrhoeae: NOT DETECTED

## 2017-04-17 LAB — TYPE AND SCREEN
ABO/RH(D): B POS
ANTIBODY SCREEN: NEGATIVE

## 2017-04-17 LAB — CBC
HEMATOCRIT: 38.3 % (ref 35.0–47.0)
Hemoglobin: 12.8 g/dL (ref 12.0–16.0)
MCH: 29.8 pg (ref 26.0–34.0)
MCHC: 33.5 g/dL (ref 32.0–36.0)
MCV: 89.1 fL (ref 80.0–100.0)
Platelets: 240 10*3/uL (ref 150–440)
RBC: 4.3 MIL/uL (ref 3.80–5.20)
RDW: 15.3 % — AB (ref 11.5–14.5)
WBC: 18.5 10*3/uL — AB (ref 3.6–11.0)

## 2017-04-17 LAB — RAPID HIV SCREEN (HIV 1/2 AB+AG)
HIV 1/2 ANTIBODIES: NONREACTIVE
HIV-1 P24 Antigen - HIV24: NONREACTIVE

## 2017-04-17 MED ORDER — PRENATAL MULTIVITAMIN CH
1.0000 | ORAL_TABLET | Freq: Every day | ORAL | Status: DC
  Administered 2017-04-17 – 2017-04-19 (×2): 1 via ORAL
  Filled 2017-04-17 (×2): qty 1

## 2017-04-17 MED ORDER — OXYTOCIN 10 UNIT/ML IJ SOLN
INTRAMUSCULAR | Status: AC
  Filled 2017-04-17: qty 2

## 2017-04-17 MED ORDER — TETANUS-DIPHTH-ACELL PERTUSSIS 5-2.5-18.5 LF-MCG/0.5 IM SUSP: 0.5000 mL | Freq: Once | INTRAMUSCULAR | Status: DC

## 2017-04-17 MED ORDER — OXYCODONE-ACETAMINOPHEN 5-325 MG PO TABS: 1.0000 | ORAL_TABLET | ORAL | Status: DC | PRN

## 2017-04-17 MED ORDER — BENZOCAINE-MENTHOL 20-0.5 % EX AERO: 1.0000 "application " | INHALATION_SPRAY | CUTANEOUS | Status: DC | PRN

## 2017-04-17 MED ORDER — DIBUCAINE 1 % RE OINT: 1.0000 "application " | TOPICAL_OINTMENT | RECTAL | Status: DC | PRN

## 2017-04-17 MED ORDER — SENNOSIDES-DOCUSATE SODIUM 8.6-50 MG PO TABS
2.0000 | ORAL_TABLET | ORAL | Status: DC
  Administered 2017-04-18 – 2017-04-19 (×2): 2 via ORAL
  Filled 2017-04-17 (×2): qty 2

## 2017-04-17 MED ORDER — OXYTOCIN 40 UNITS IN LACTATED RINGERS INFUSION - SIMPLE MED
INTRAVENOUS | Status: AC
  Filled 2017-04-17: qty 1000

## 2017-04-17 MED ORDER — OXYCODONE-ACETAMINOPHEN 5-325 MG PO TABS: 2.0000 | ORAL_TABLET | ORAL | Status: DC | PRN

## 2017-04-17 MED ORDER — MISOPROSTOL 200 MCG PO TABS
ORAL_TABLET | ORAL | Status: AC
  Filled 2017-04-17: qty 4

## 2017-04-17 MED ORDER — IBUPROFEN 800 MG PO TABS
ORAL_TABLET | ORAL | Status: AC
  Filled 2017-04-17: qty 1

## 2017-04-17 MED ORDER — DIPHENHYDRAMINE HCL 25 MG PO CAPS: 25.0000 mg | ORAL_CAPSULE | Freq: Four times a day (QID) | ORAL | Status: DC | PRN

## 2017-04-17 MED ORDER — AMMONIA AROMATIC IN INHA
RESPIRATORY_TRACT | Status: AC
  Filled 2017-04-17: qty 10

## 2017-04-17 MED ORDER — LIDOCAINE HCL (PF) 1 % IJ SOLN
INTRAMUSCULAR | Status: AC
  Filled 2017-04-17: qty 30

## 2017-04-17 MED ORDER — IBUPROFEN 800 MG PO TABS
800.0000 mg | ORAL_TABLET | Freq: Four times a day (QID) | ORAL | Status: DC
  Administered 2017-04-17 – 2017-04-19 (×10): 800 mg via ORAL
  Filled 2017-04-17 (×9): qty 1

## 2017-04-17 MED ORDER — ZOLPIDEM TARTRATE 5 MG PO TABS: 5.0000 mg | ORAL_TABLET | Freq: Every evening | ORAL | Status: DC | PRN

## 2017-04-17 MED ORDER — BUTORPHANOL TARTRATE 1 MG/ML IJ SOLN: 2.0000 mg | INTRAMUSCULAR | Status: DC | PRN

## 2017-04-17 MED ORDER — OXYTOCIN BOLUS FROM INFUSION
500.0000 mL | Freq: Once | INTRAVENOUS | Status: AC
  Administered 2017-04-17: 500 mL via INTRAVENOUS

## 2017-04-17 MED ORDER — MEDROXYPROGESTERONE ACETATE 150 MG/ML IM SUSP: 150.0000 mg | INTRAMUSCULAR | Status: DC | PRN

## 2017-04-17 MED ORDER — COCONUT OIL OIL: 1.0000 "application " | TOPICAL_OIL | Status: DC | PRN

## 2017-04-17 MED ORDER — ONDANSETRON HCL 4 MG/2ML IJ SOLN: 4.0000 mg | Freq: Four times a day (QID) | INTRAMUSCULAR | Status: DC | PRN

## 2017-04-17 MED ORDER — WITCH HAZEL-GLYCERIN EX PADS: 1.0000 "application " | MEDICATED_PAD | CUTANEOUS | Status: DC | PRN

## 2017-04-17 MED ORDER — SIMETHICONE 80 MG PO CHEW: 80.0000 mg | CHEWABLE_TABLET | ORAL | Status: DC | PRN

## 2017-04-17 MED ORDER — LIDOCAINE HCL (PF) 1 % IJ SOLN: 30.0000 mL | INTRAMUSCULAR | Status: DC | PRN

## 2017-04-17 MED ORDER — ONDANSETRON HCL 4 MG PO TABS: 4.0000 mg | ORAL_TABLET | ORAL | Status: DC | PRN

## 2017-04-17 MED ORDER — LACTATED RINGERS IV SOLN: 500.0000 mL | INTRAVENOUS | Status: DC | PRN

## 2017-04-17 MED ORDER — METHYLERGONOVINE MALEATE 0.2 MG PO TABS: 0.2000 mg | ORAL_TABLET | ORAL | Status: DC | PRN

## 2017-04-17 MED ORDER — ONDANSETRON HCL 4 MG/2ML IJ SOLN: 4.0000 mg | INTRAMUSCULAR | Status: DC | PRN

## 2017-04-17 MED ORDER — SOD CITRATE-CITRIC ACID 500-334 MG/5ML PO SOLN: 30.0000 mL | ORAL | Status: DC | PRN

## 2017-04-17 MED ORDER — ACETAMINOPHEN 325 MG PO TABS: 650.0000 mg | ORAL_TABLET | ORAL | Status: DC | PRN

## 2017-04-17 MED ORDER — METHYLERGONOVINE MALEATE 0.2 MG/ML IJ SOLN: 0.2000 mg | INTRAMUSCULAR | Status: DC | PRN

## 2017-04-17 MED ORDER — ACETAMINOPHEN 325 MG PO TABS
650.0000 mg | ORAL_TABLET | ORAL | Status: DC | PRN
  Administered 2017-04-17: 650 mg via ORAL
  Filled 2017-04-17: qty 2

## 2017-04-17 MED ORDER — OXYTOCIN 40 UNITS IN LACTATED RINGERS INFUSION - SIMPLE MED
2.5000 [IU]/h | INTRAVENOUS | Status: DC
  Administered 2017-04-17: 2.5 [IU]/h via INTRAVENOUS

## 2017-04-17 MED ORDER — LACTATED RINGERS IV SOLN: INTRAVENOUS | Status: DC

## 2017-04-17 NOTE — Lactation Note (Signed)
This note was copied from a baby's chart. Lactation Consultation Note  Patient Name: Renee Potter Today's Date: 04/17/2017   [facility], Mom stated that baby was breastfeeding well. She denied having questions or needs. I gave her LC and [facility] info  Maternal Data    Feeding    LATCH Score                   Interventions    Lactation Tools Discussed/Used     Consult Status      Sunday CornSandra Clark Feleshia Zundel 04/17/2017, 3:22 PM

## 2017-04-18 DIAGNOSIS — Q513 Bicornate uterus: Secondary | ICD-10-CM

## 2017-04-18 LAB — CBC
HCT: 33.7 % — ABNORMAL LOW (ref 35.0–47.0)
HEMOGLOBIN: 11.3 g/dL — AB (ref 12.0–16.0)
MCH: 30.1 pg (ref 26.0–34.0)
MCHC: 33.6 g/dL (ref 32.0–36.0)
MCV: 89.7 fL (ref 80.0–100.0)
PLATELETS: 220 10*3/uL (ref 150–440)
RBC: 3.76 MIL/uL — ABNORMAL LOW (ref 3.80–5.20)
RDW: 15.4 % — AB (ref 11.5–14.5)
WBC: 11.4 10*3/uL — ABNORMAL HIGH (ref 3.6–11.0)

## 2017-04-18 LAB — RUBELLA SCREEN: Rubella: 1.47 index (ref >0.99)

## 2017-04-18 MED ORDER — MEDROXYPROGESTERONE ACETATE 150 MG/ML IM SUSP
150.0000 mg | Freq: Once | INTRAMUSCULAR | Status: AC
  Administered 2017-04-18: 150 mg via INTRAMUSCULAR
  Filled 2017-04-18: qty 1

## 2017-04-18 MED ORDER — IBUPROFEN 800 MG PO TABS: 800.0000 mg | ORAL_TABLET | Freq: Four times a day (QID) | ORAL | 1 refills | Status: AC

## 2017-04-18 NOTE — Progress Notes (Signed)
Post Partum Day # 1, s/p SVD  Subjective: no complaints, up ad lib, voiding and tolerating PO  Objective: Temp:  [98 F (36.7 C)-98.9 F (37.2 C)] 98 F (36.7 C) (12/26 0823) Pulse Rate:  [84-97] 90 (12/26 0823) Resp:  [18-20] 18 (12/26 0823) BP: (126-129)/(69-79) 129/79 (12/26 0823) SpO2:  [100 %] 100 % (12/26 [id number])  Physical Exam:  General: alert and no distress  Lungs: clear to auscultation bilaterally Breasts: normal appearance, no masses or tenderness Heart: regular rate and rhythm, S1, S2 normal, no murmur, click, rub or gallop Abdomen: soft, non-tender; bowel sounds normal; no masses,  no organomegaly Pelvis: Lochia: appropriate, Uterine Fundus: firm  Extremities: DVT Evaluation: No evidence of DVT seen on physical exam. Negative Homan's sign. No cords or calf tenderness. No significant calf/ankle edema.  Recent Labs    04/17/17 0144 04/18/17 0432  HGB 12.8 11.3*  HCT 38.3 33.7*    Assessment/Plan: Doing well postpartum Breastfeeding, Lactation consult Social Work [facility] Depo Provera Plan for discharge tomorrow   LOS: 1 day   Renee Potter, Laren Whaling, MD [facility] 04/18/2017 12:09 PM

## 2017-04-18 NOTE — Clinical Social Work Maternal (Signed)
  CLINICAL SOCIAL WORK MATERNAL/CHILD NOTE  Patient Details  Name: HONORA SEARSON MRN: [id number] Date of Birth: 1995-12-28  Date:  04/18/2017  Clinical Social Worker Initiating Note:  ([place], [place] [phone] ) Date/Time: Initiated:  04/18/17/1611     Child's Name:  Garnette Gunner)   Biological Parents:  Mother, Father   Need for Interpreter:  None   Reason for Referral:  Late or No Prenatal Care    Address:  [address]  [address]    Phone number:  [phone] (home)     Additional phone number: N/A  Household Members/Support Persons (HM/SP):   Household Member/Support Person 1   HM/SP Name Relationship DOB or Age  HM/SP -1 Midwife) (Father of the baby)    HM/SP -2        HM/SP -3        HM/SP -4        HM/SP -5        HM/SP -6        HM/SP -7        HM/SP -8          Natural Supports (not living in the home):  Extended Family, Immediate Family   Professional Supports: Other (Comment)([employer]. )   Employment: Full-time   Type of Work: ([employer] )   Education:  High school graduate   Homebound arranged:    Museum/gallery curator Resources:  Medicaid   Other Resources:  Physicist, medical , [place] Considerations Which May Impact Care:  N/A  Strengths:  Ability to meet basic needs , Compliance with medical plan , Home prepared for child    Psychotropic Medications:         Pediatrician:       Pediatrician List:   [place]      Pediatrician Fax Number:    Risk Factors/Current Problems:  None   Cognitive State:  Alert , Linear Thinking    Mood/Affect:  Relaxed , Happy    CSW Assessment: Clinical Social Worker (CSW) received a consult for lack of prenatal care. Per nurse patient has been approprate and there are no other concerns. CSW met with patient alone at bedside to address consult.  Infant's grandmother, patient's friend and patient's sister were at bedside. CSW asked family to step out while CSW spoke with mother alone. Per mother she moved to [facility] from [place] to be near supportive family. Per mother she did not have medicaid when she first moved to [facility] which delayed her prenatal care. Mother denies alcohol and drug use. Per mother she works at [employer] in [employer] and her boyfriend/ father of the baby also works at [employer] in [employer]. Per mother she and her boyfriend [place] and their 2 other children live together in [place]. Per mother her home is prepared for infant and they have all the supplies needed, including car seat and crib. Per mother she is ready to take baby home and has no other needs or concerns. Please reconsult if future social work needs arise. CSW signing off.   CSW Plan/Description:  No Further Intervention Required/No Barriers to Discharge    Aleynah Rocchio, Lenice Llamas 04/18/2017, 4:13 PM

## 2017-04-18 NOTE — Progress Notes (Signed)
Clinical Social Worker (CSW) received a consult for insufficient prenatal care. Per nurse patient is appropriate and has a negative drug screen. Per nurse there are no concerns at this time. Please reconsult if future social work needs arise. CSW signing off.   Baker Hughes IncorporatedBailey Romey Mathieson, LCSW [phone]

## 2017-04-19 LAB — RPR: RPR: NONREACTIVE

## 2017-04-19 LAB — CULTURE, BETA STREP (GROUP B ONLY)

## 2017-04-19 NOTE — Progress Notes (Signed)
Discharge to home.  To car via auxillary 

## 2017-04-19 NOTE — Progress Notes (Signed)
Discharge inst reviewed with pt.  Verb u/o for f/u care

## 2017-04-19 NOTE — Discharge Summary (Signed)
Obstetric Discharge Summary Reason for Admission: onset of labor Prenatal Procedures: ultrasound Intrapartum Procedures: spontaneous vaginal delivery Postpartum Procedures: none Complications-Operative and Postpartum: none Hemoglobin  Date Value Ref Range Status  04/18/2017 11.3 (L) 12.0 - 16.0 g/dL Final  69/62/952811/11/2016 41.312.0  Final   HCT  Date Value Ref Range Status  04/18/2017 33.7 (L) 35.0 - 47.0 % Final  12/07/2016 33  Final    Physical Exam:  General: alert and no distress Lochia: appropriate Uterine Fundus: firm Incision: none DVT Evaluation: No evidence of DVT seen on physical exam. Negative Homan's sign. No cords or calf tenderness. No significant calf/ankle edema.  Discharge Diagnoses: Term Pregnancy-delivered  Discharge Information: Date: 04/19/2017 Activity: pelvic rest Diet: routine Medications: PNV and Ibuprofen Condition: stable Instructions: refer to practice specific booklet Discharge to: home Follow-up Information    [facility], [facility].   Why:  2 weeks (depression check) and 6 weeks (postpartum visit) Contact information: [address] [place] [address] [place] [id number] [phone]           Newborn Data: Live born female  Birth Weight: 8 lb 10.3 oz (3920 g) APGAR: 7, 9  Newborn Delivery   Birth date/time:  04/17/2017 00:58:00 Delivery type:  Vaginal, Spontaneous     Home with mother.  Renee Potter 04/19/2017, 7:20 AM

## 2017-12-26 IMAGING — US US OB LIMITED
1 series · 14 of 16 positions shown · non-contrast
Comparison: none

CLINICAL DATA: Single episode of bright red vaginal bleeding after
falling yesterday on her left side. Twenty weeks and 2 days
pregnant.

EXAM:
LIMITED OBSTETRIC ULTRASOUND

[Series 1: us ob limited · 0.20mm/px · 14 of 16 slices shown]
[im 1/16]
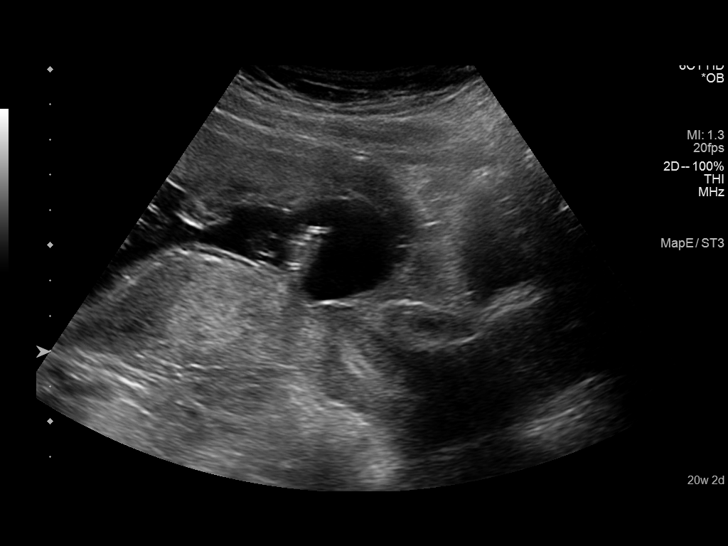
[im 2/16]
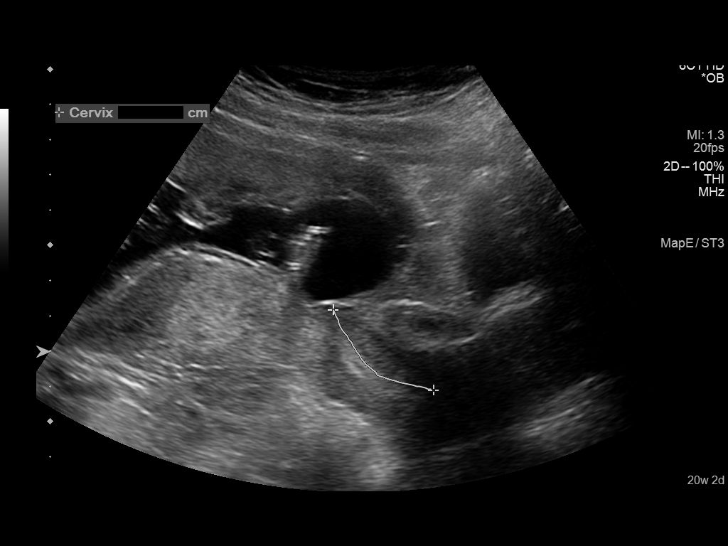
[im 3/16]
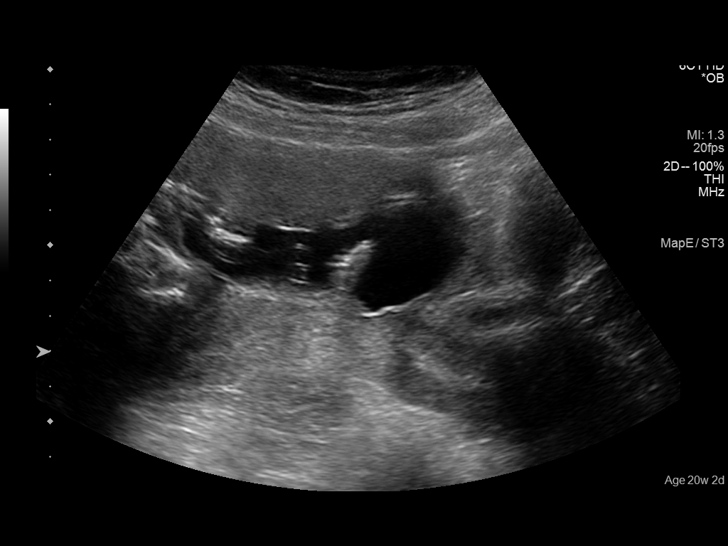
[im 5/16]
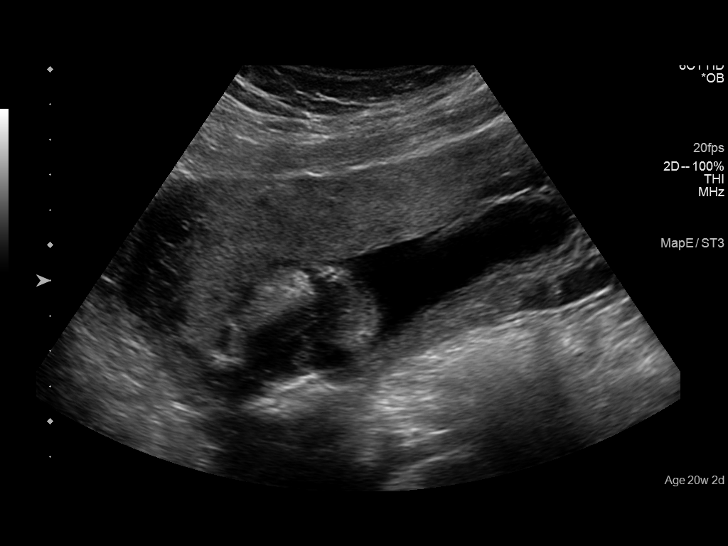
[im 6/16]
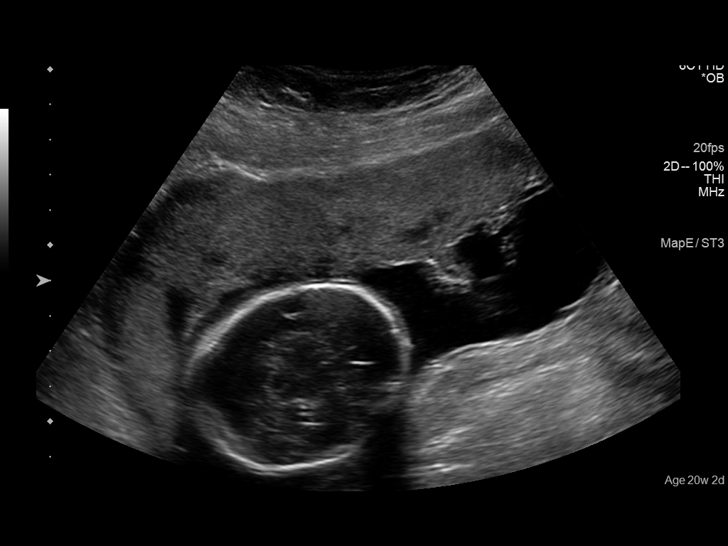
[im 7/16]
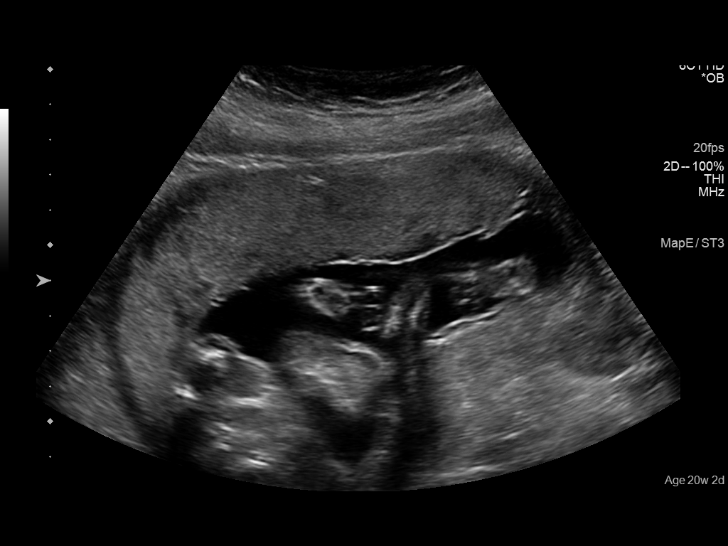
[im 8/16]
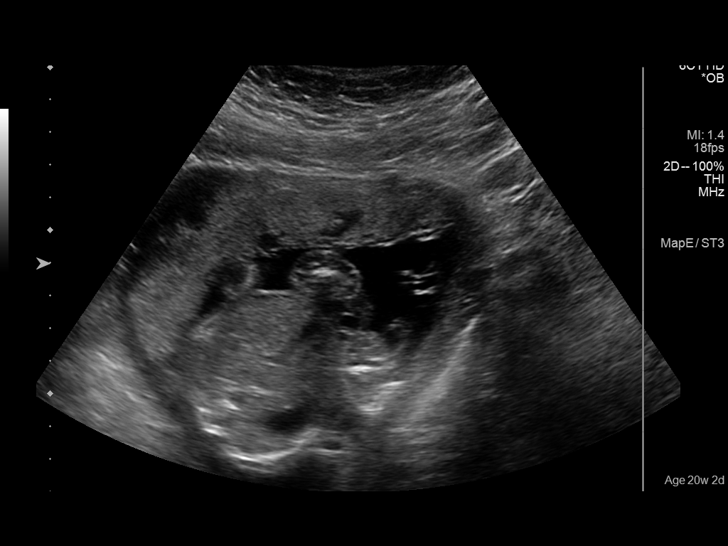
[im 9/16]
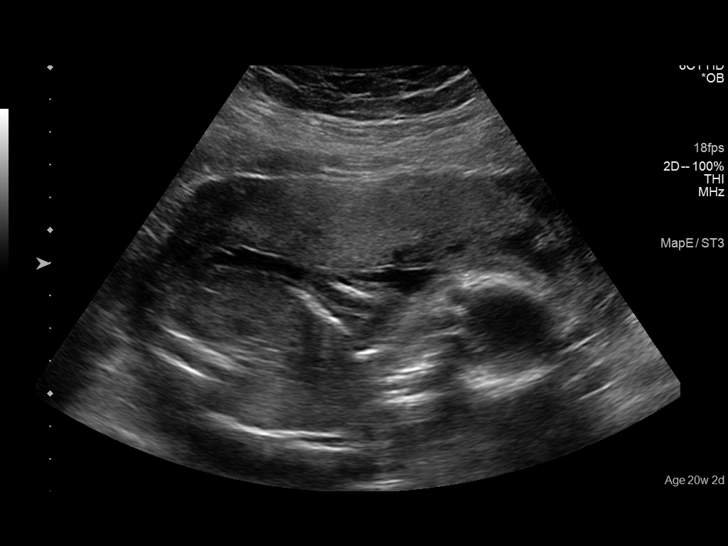
[im 10/16]
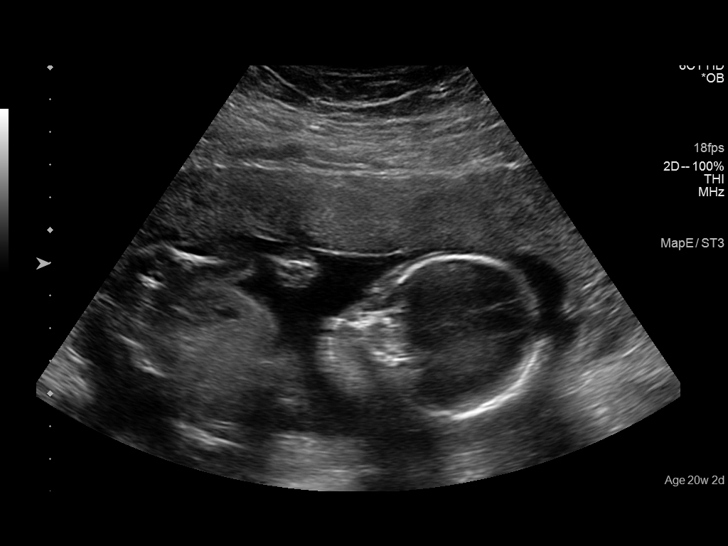
[im 11/16]
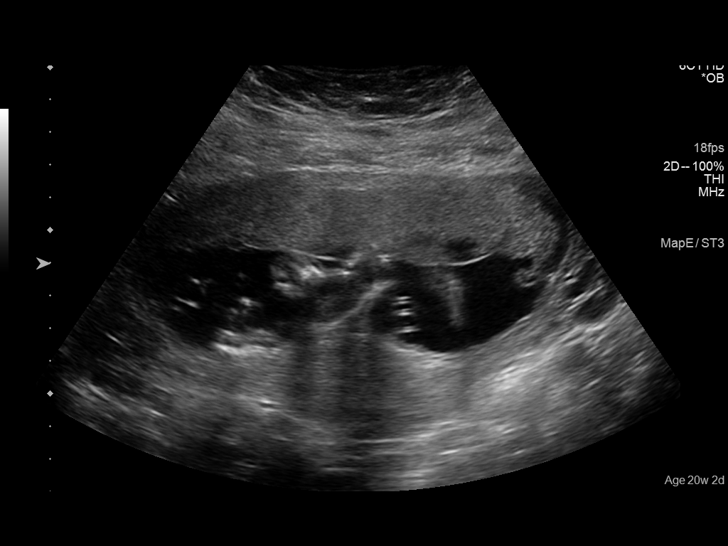
[im 13/16]
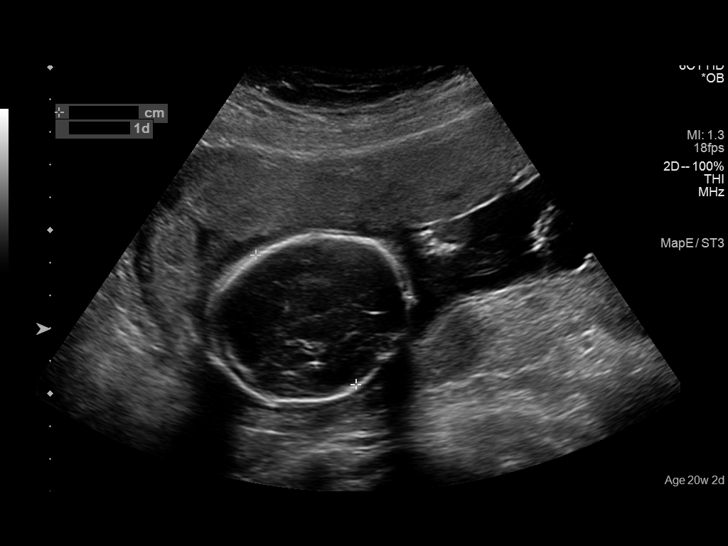
[im 14/16]
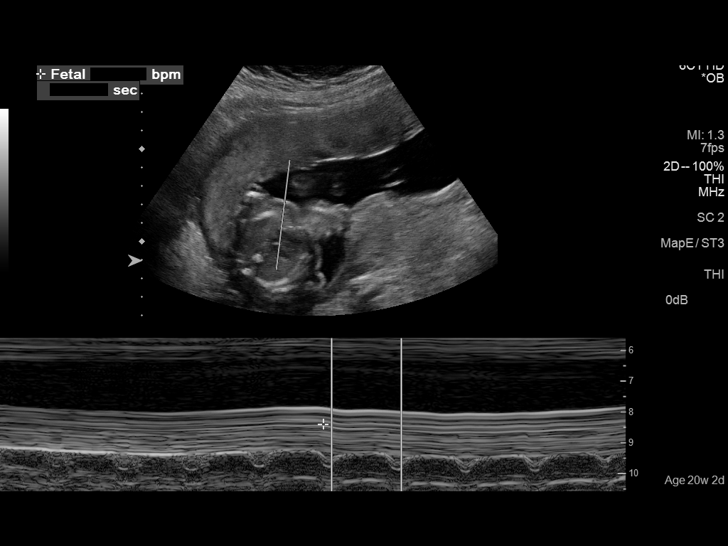
[im 15/16]
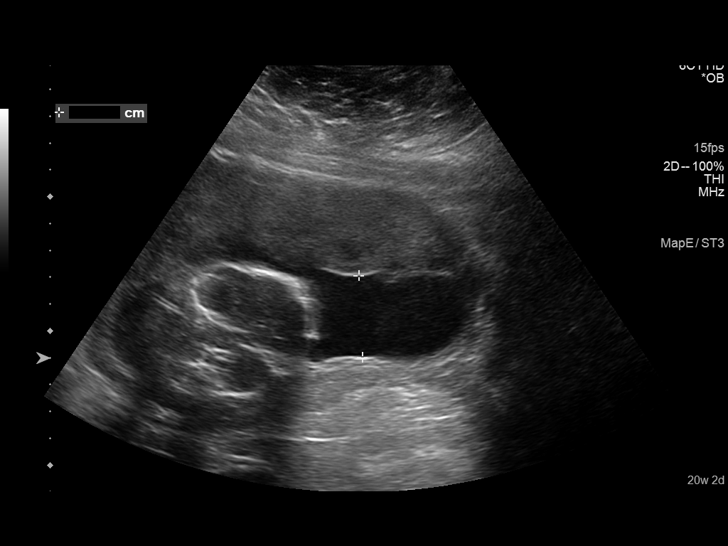
[im 16/16]
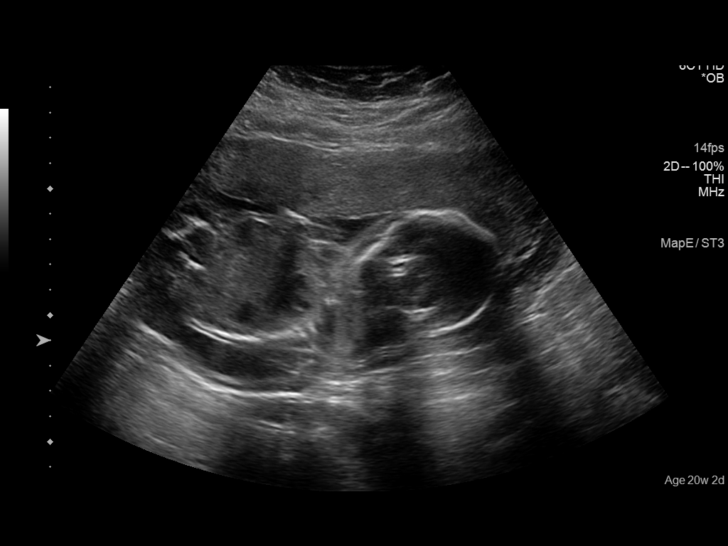

[14 of 16 positions shown; findings below may reference images not displayed]

FINDINGS: Number of Fetuses:  1

Heart Rate:  147 bpm

Presentation: Transverse with the head on the maternal left.

Placental Location: Anterior

Amniotic Fluid (Subjective):  Within normal limits.

BPD:  5.0cm 21w  1d

MATERNAL FINDINGS:

Cervix:  Appears closed.

Uterus/Adnexae:  No abnormality visualized.
IMPRESSION: Single live intrauterine gestation in a transverse presentation with
an estimated gestational age of 21 weeks and 1 day. No complicating
features.

This exam is performed on an emergent basis and does not
comprehensively evaluate fetal size, dating, or anatomy; follow-up
complete OB US should be considered if further fetal assessment is
warranted.

## 2018-01-04 ENCOUNTER — Encounter: Payer: Self-pay | Admitting: Emergency Medicine

## 2018-01-04 ENCOUNTER — Emergency Department
Admission: EM | Admit: 2018-01-04 | Discharge: 2018-01-04 | Disposition: A | Discharge status: Home / Self Care | Payer: Medicaid Other | Attending: Emergency Medicine | Admitting: Emergency Medicine

## 2018-01-04 DIAGNOSIS — A084 Viral intestinal infection, unspecified: Secondary | ICD-10-CM | POA: Insufficient documentation

## 2018-01-04 DIAGNOSIS — Z87891 Personal history of nicotine dependence: Secondary | ICD-10-CM | POA: Insufficient documentation

## 2018-01-04 DIAGNOSIS — R1084 Generalized abdominal pain: Secondary | ICD-10-CM

## 2018-01-04 DIAGNOSIS — R197 Diarrhea, unspecified: Secondary | ICD-10-CM

## 2018-01-04 DIAGNOSIS — R112 Nausea with vomiting, unspecified: Secondary | ICD-10-CM

## 2018-01-04 LAB — CBC
HCT: 47.8 % — ABNORMAL HIGH (ref 35.0–47.0)
HEMOGLOBIN: 16.6 g/dL — AB (ref 12.0–16.0)
MCH: 29.2 pg (ref 26.0–34.0)
MCHC: 34.7 g/dL (ref 32.0–36.0)
MCV: 84.1 fL (ref 80.0–100.0)
PLATELETS: 244 10*3/uL (ref 150–440)
RBC: 5.68 MIL/uL — ABNORMAL HIGH (ref 3.80–5.20)
RDW: 14.9 % — ABNORMAL HIGH (ref 11.5–14.5)
WBC: 16.9 10*3/uL — AB (ref 3.6–11.0)

## 2018-01-04 LAB — COMPREHENSIVE METABOLIC PANEL
ALBUMIN: 4 g/dL (ref 3.5–5.0)
ALT: 27 U/L (ref 0–44)
ANION GAP: 9 (ref 5–15)
AST: 26 U/L (ref 15–41)
Alkaline Phosphatase: 69 U/L (ref 38–126)
BUN: 8 mg/dL (ref 6–20)
CO2: 22 mmol/L (ref 22–32)
Calcium: 8.6 mg/dL — ABNORMAL LOW (ref 8.9–10.3)
Chloride: 107 mmol/L (ref 98–111)
Creatinine, Ser: 0.62 mg/dL (ref 0.44–1.00)
GFR calc Af Amer: >60 mL/min (ref >60)
GFR calc non Af Amer: >60 mL/min (ref >60)
Glucose, Bld: 104 mg/dL — ABNORMAL HIGH (ref 70–99)
POTASSIUM: 3.6 mmol/L (ref 3.5–5.1)
Sodium: 138 mmol/L (ref 135–145)
TOTAL PROTEIN: 7.1 g/dL (ref 6.5–8.1)
Total Bilirubin: 0.9 mg/dL (ref 0.3–1.2)

## 2018-01-04 LAB — POC URINE PREG, ED: Preg Test, Ur: NEGATIVE

## 2018-01-04 LAB — URINALYSIS, COMPLETE (UACMP) WITH MICROSCOPIC
Bacteria, UA: NONE SEEN
Bilirubin Urine: NEGATIVE
GLUCOSE, UA: NEGATIVE mg/dL
HGB URINE DIPSTICK: NEGATIVE
KETONES UR: NEGATIVE mg/dL
Nitrite: NEGATIVE
PH: 5 (ref 5.0–8.0)
Protein, ur: 100 mg/dL — AB
SPECIFIC GRAVITY, URINE: 1.025 (ref 1.005–1.030)

## 2018-01-04 LAB — LIPASE, BLOOD: LIPASE: 29 U/L (ref 11–51)

## 2018-01-04 MED ORDER — ONDANSETRON 4 MG PO TBDP: 4.0000 mg | ORAL_TABLET | Freq: Three times a day (TID) | ORAL | 0 refills | Status: AC | PRN

## 2018-01-04 MED ORDER — KETOROLAC TROMETHAMINE 30 MG/ML IJ SOLN
15.0000 mg | Freq: Once | INTRAMUSCULAR | Status: AC
  Administered 2018-01-04: 15 mg via INTRAVENOUS
  Filled 2018-01-04: qty 1

## 2018-01-04 MED ORDER — ONDANSETRON HCL 4 MG/2ML IJ SOLN
4.0000 mg | Freq: Once | INTRAMUSCULAR | Status: AC
  Administered 2018-01-04: 4 mg via INTRAVENOUS
  Filled 2018-01-04: qty 2

## 2018-01-04 MED ORDER — SODIUM CHLORIDE 0.9 % IV BOLUS
1000.0000 mL | Freq: Once | INTRAVENOUS | Status: AC
  Administered 2018-01-04: 1000 mL via INTRAVENOUS

## 2018-01-04 NOTE — ED Triage Notes (Signed)
Pt to ED with c/o of abdominal pain that started last night. Pt states N/V/D. Pt afebrile in triage.

## 2018-01-04 NOTE — ED Notes (Signed)
Pt presents with abd upper GI pain that started last night. Pt states she has had n/V/D for 2 days. Pt is NAD A/Ox4. EDP at bedside.
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
## 2018-01-04 NOTE — Discharge Instructions (Signed)

## 2019-03-22 IMAGING — US US OB COMP +14 WK
1 series · 13 of 28 positions shown · non-contrast
Comparison: none

CLINICAL DATA: Uncertain dates, third trimester, prenatal care.

EXAM:
OBSTETRICAL ULTRASOUND >14 WKS

[Series 1: us ob comp +14 wk · 0.31mm/px · 13 of 69 slices shown]
[im 3/69]
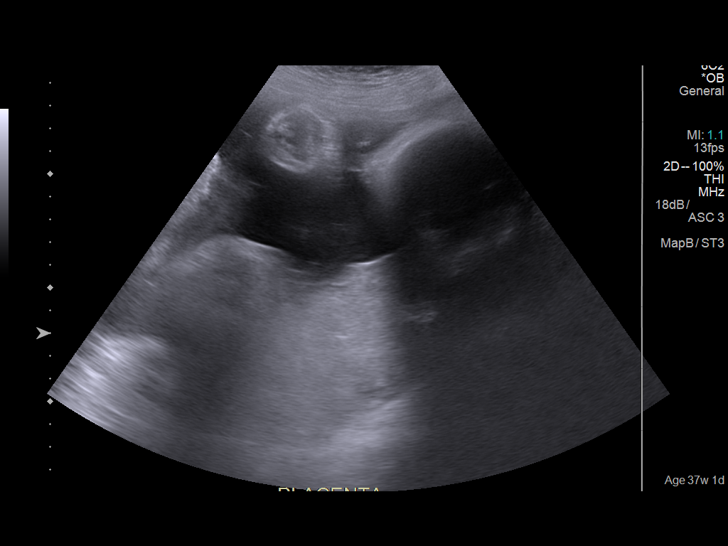
[im 8/69]
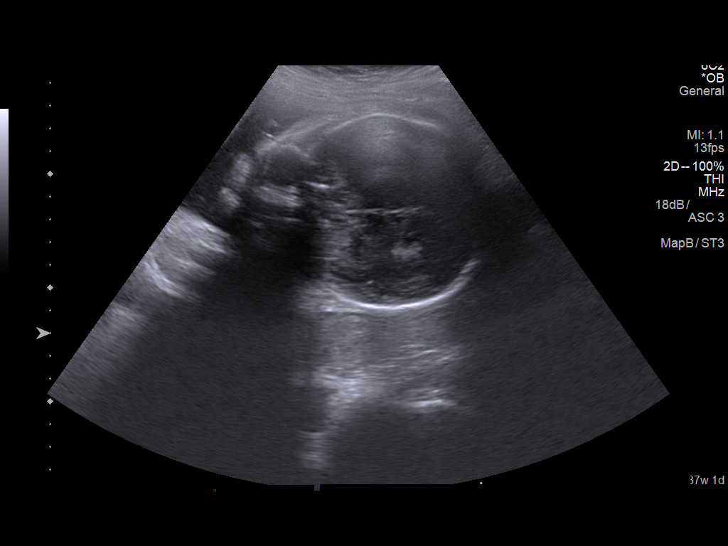
[im 13/69]
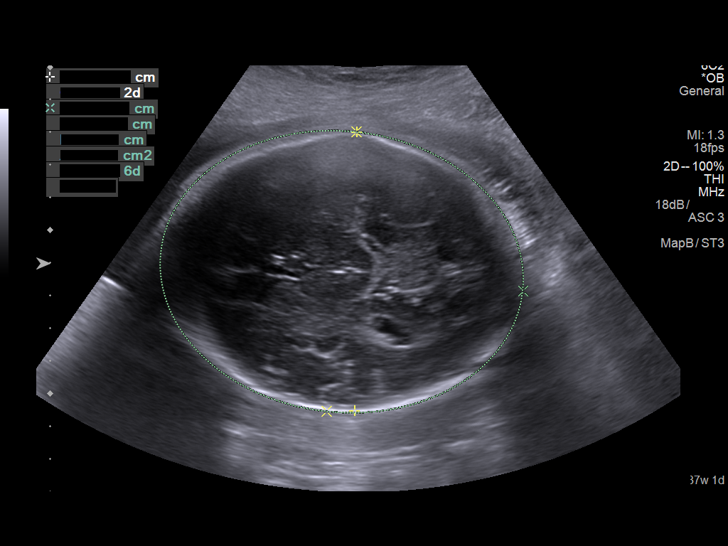
[im 18/69]
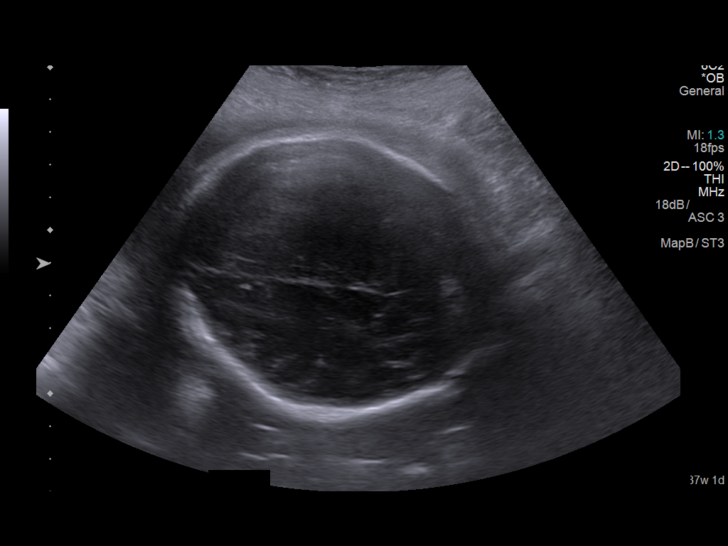
[im 23/69]
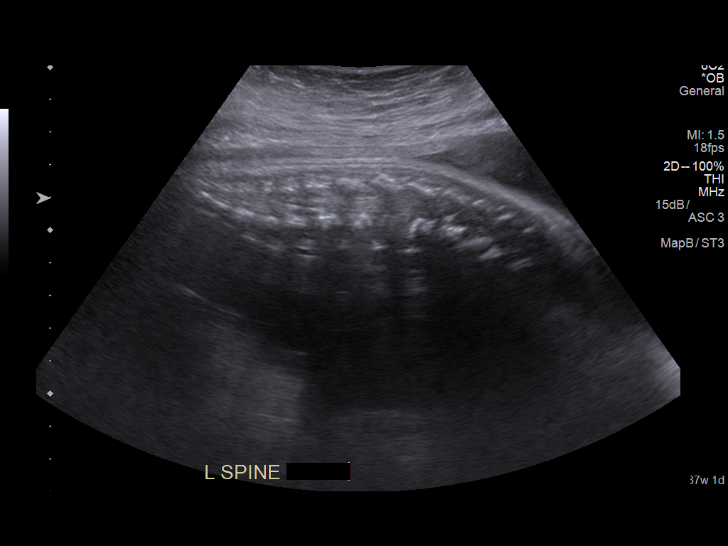
[im 28/69]
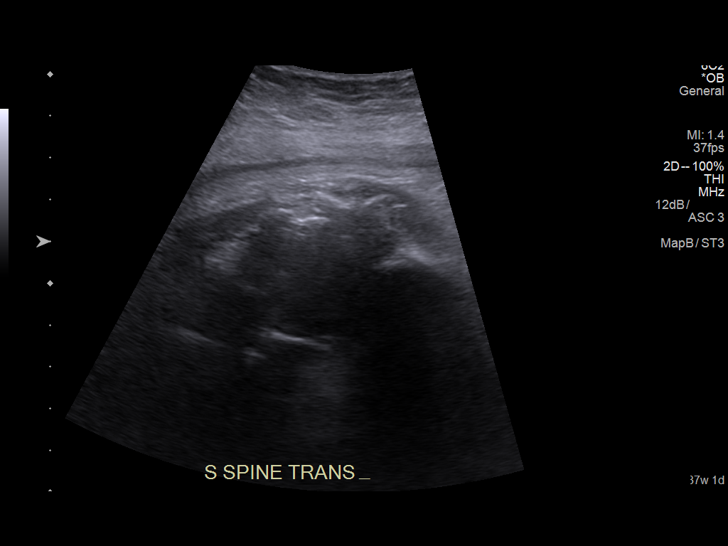
[im 36/69]
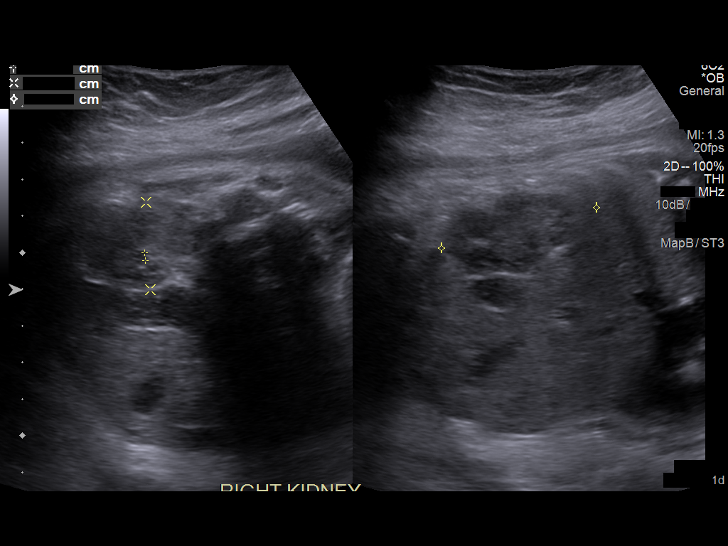
[im 41/69]
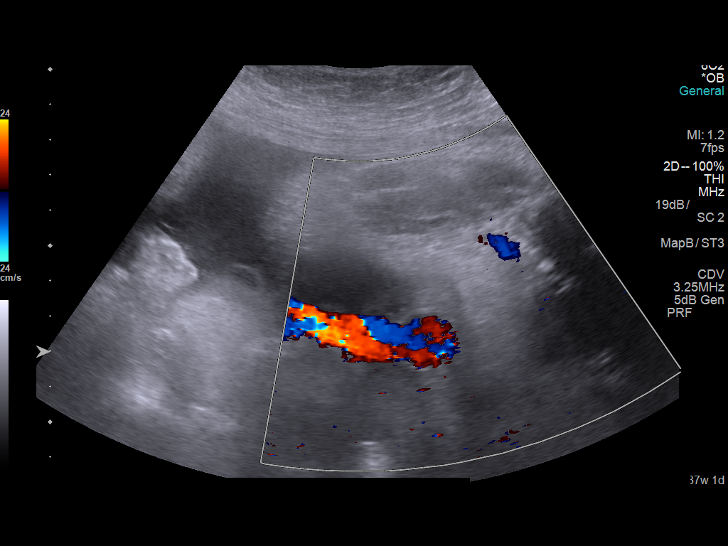
[im 46/69]
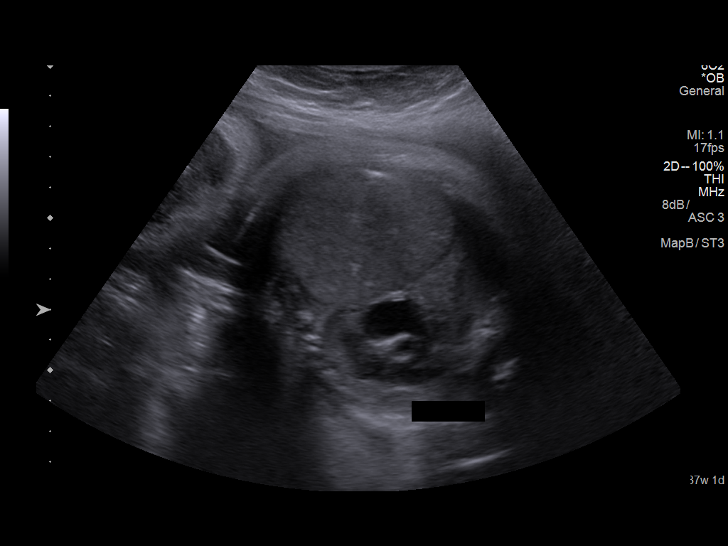
[im 51/69]
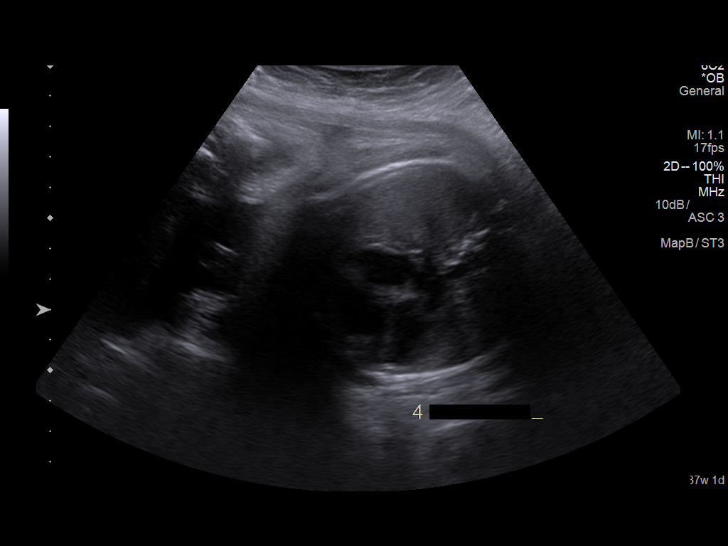
[im 56/69]
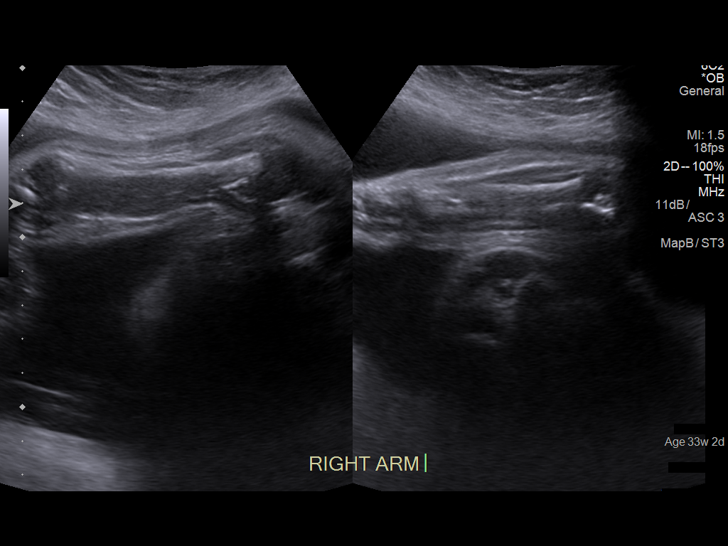
[im 61/69]
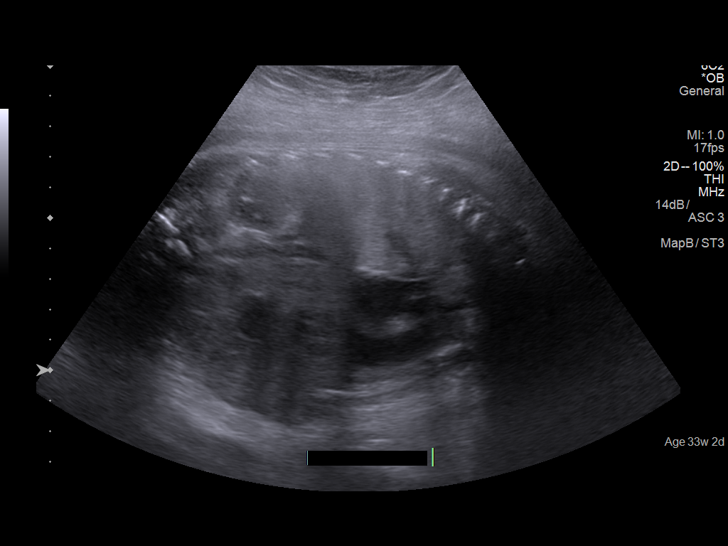
[im 66/69]
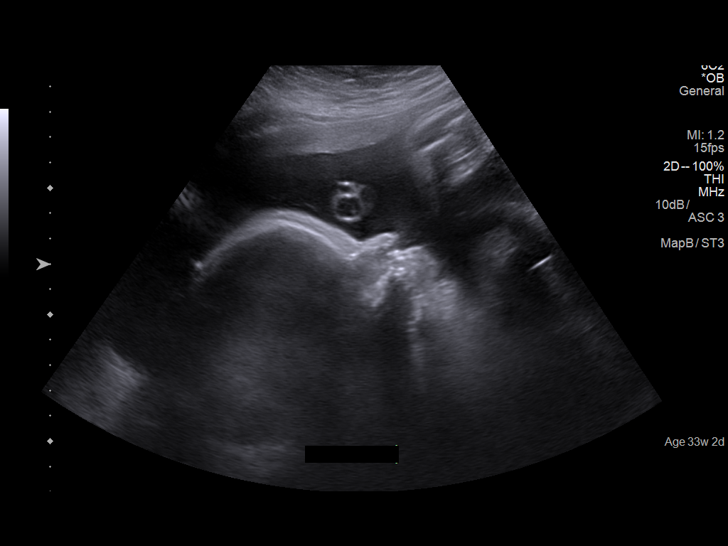

[13 of 28 positions shown; findings below may reference images not displayed]

FINDINGS: Number of Fetuses: 1

Heart Rate:  131 bpm

Movement: Present

Presentation: Cephalic

Previa: None. The distance from the lower placental segment to the
internal cervical os is 6.2 cm

Placental Location: Posterior and partially fundal.

Amniotic Fluid (Subjective): Normal

Amniotic Fluid (Objective):  Normal

AFI 18.7 cm (5%ile= 8.3 cm, 95%= 24.5 cm for 33 wks)

FETAL BIOMETRY

BPD:  8.5cm 34w 2d

HC:    31.23cm  35w   0d

AC:   31.61cm  35w   4d

FL:   6.73cm  34w   4d

Current Mean GA: 34w 5d              US EDC: April 12, 2017.

Estimated Fetal Weight:  2591g    19%ile

FETAL ANATOMY

Lateral Ventricles: Visualized

Thalami/CSP: Visualized

Posterior Fossa:  Visualized

Upper Lip: Visualized

Spine: Visualized

4 Chamber Heart on Left: Visualized

LVOT: Visualized

RVOT: Visualized

Stomach on Left: Visualized

3 Vessel Cord: Visualized

Cord Insertion site: Visualized

Kidneys: Visualized

Bladder: Visualized

Extremities: Visualize

Sex: Male genitalia

Technically difficult due to: Advanced gestational age

Maternal Findings:

Cervix:  4 cm and appears closed
IMPRESSION: There is a single viable IUP with estimated gestational age of 34
weeks 5 days with estimated date of confinement April 12, 2017. No fetal anomalies are observed. The presentation is cephalic.
The placenta is posterior and partially fundal. The amniotic fluid
index is normal.
